# Patient Record
Sex: Female | Born: 1975 | Race: Black or African American | Hispanic: No | Marital: Married | Smoking: Never smoker
Health system: Southern US, Community
[De-identification: ages and names within clinical notes are randomized; demographics above are authoritative.]

## PROBLEM LIST (undated history)

## (undated) DIAGNOSIS — Z789 Other specified health status: Secondary | ICD-10-CM

## (undated) DIAGNOSIS — R87629 Unspecified abnormal cytological findings in specimens from vagina: Secondary | ICD-10-CM

## (undated) HISTORY — DX: Unspecified abnormal cytological findings in specimens from vagina: R87.629

---

## 2014-06-11 DIAGNOSIS — Z3009 Encounter for other general counseling and advice on contraception: Secondary | ICD-10-CM | POA: Insufficient documentation

## 2014-09-14 ENCOUNTER — Emergency Department (HOSPITAL_COMMUNITY)
Admission: EM | Admit: 2014-09-14 | Discharge: 2014-09-14 | Disposition: A | Discharge status: Home / Self Care | Payer: Medicaid Other | Attending: Emergency Medicine | Admitting: Emergency Medicine

## 2014-09-14 ENCOUNTER — Encounter (HOSPITAL_COMMUNITY): Payer: Self-pay | Admitting: Emergency Medicine

## 2014-09-14 DIAGNOSIS — M545 Low back pain, unspecified: Secondary | ICD-10-CM

## 2014-09-14 MED ORDER — CYCLOBENZAPRINE HCL 10 MG PO TABS: 10.0000 mg | ORAL_TABLET | Freq: Two times a day (BID) | ORAL | Status: DC | PRN

## 2014-09-14 MED ORDER — IBUPROFEN 400 MG PO TABS: 400.0000 mg | ORAL_TABLET | Freq: Four times a day (QID) | ORAL | Status: DC | PRN

## 2014-09-14 MED ORDER — HYDROCODONE-ACETAMINOPHEN 5-325 MG PO TABS
1.0000 | ORAL_TABLET | Freq: Once | ORAL | Status: AC
  Administered 2014-09-14: 1 via ORAL
  Filled 2014-09-14: qty 1

## 2014-09-14 NOTE — ED Provider Notes (Signed)
CSN: [id number]     Arrival date & time 09/14/14  1016 History  This chart was scribed for non-physician practitioner, Oswaldo ConroyVictoria Yurika Pereda, PA-C, working with Elwin MochaBlair Walden, MD by Charline BillsEssence Howell, ED Scribe. This patient was seen in room TR10C/TR10C and the patient's care was started at 11:21 AM.  Chief Complaint  Patient presents with  . Back Pain   The history is provided by the patient. No language interpreter was used.   HPI Comments: Shelley Peterson is a 39 y.o. female who presents to the Emergency Department complaining of sudden onset of lower back pain since this morning. Pt bent down to pick up laundry when she noticed pain this morning. Pt reports h/o intermittent back pain. She denies numbness/tinlging in groin, anal or lower extremities, weakness, urinary or bowel incontinence, abdominal pain, night sweats, h/o IV drug use or CA. Pt has been treating with ibuprofen without relief.   History reviewed. No pertinent past medical history. History reviewed. No pertinent past surgical history. No family history on file. History  Substance Use Topics  . Smoking status: Never Smoker   . Smokeless tobacco: Not on file  . Alcohol Use: No   OB History    No data available     Review of Systems  Constitutional: Negative for diaphoresis.  Gastrointestinal: Negative for abdominal pain.  Musculoskeletal: Positive for back pain.  Neurological: Negative for weakness and numbness.   Allergies  Review of patient's allergies indicates no known allergies.  Home Medications   Prior to Admission medications   Medication Sig Start Date End Date Taking? Authorizing Provider  cyclobenzaprine (FLEXERIL) 10 MG tablet Take 1 tablet (10 mg total) by mouth 2 (two) times daily as needed for muscle spasms. 09/14/14   Oswaldo ConroyVictoria Tupac Jeffus, PA-C  ibuprofen (ADVIL,MOTRIN) 400 MG tablet Take 1 tablet (400 mg total) by mouth every 6 (six) hours as needed. 09/14/14   Oswaldo ConroyVictoria Rome Schlauch, PA-C   BP 126/73 mmHg   Pulse 69  Temp(Src) 98.5 F (36.9 C) (Oral)  Resp 18  SpO2 100%  LMP 09/08/2014 Physical Exam  Constitutional: She appears well-developed and well-nourished. No distress.  HENT:  Head: Normocephalic and atraumatic.  Eyes: Conjunctivae are normal. Right eye exhibits no discharge. Left eye exhibits no discharge.  Cardiovascular: Normal rate, regular rhythm and normal heart sounds.   Pulmonary/Chest: Effort normal and breath sounds normal. No respiratory distress. She has no wheezes.  Abdominal: Soft. Bowel sounds are normal. She exhibits no distension. There is no tenderness.  Musculoskeletal:  No midline back tenderness, step off or crepitus. Right and left sided lower back tenderness. No CVA tenderness.   Neurological: She is alert. Coordination normal.  Equal muscle tone. 5/5 strength in lower extremities. DTR equal and intact. Negative straight leg test. Normal gait.  Skin: Skin is warm and dry. She is not diaphoretic.  Nursing note and vitals reviewed.  ED Course  Procedures (including critical care time) DIAGNOSTIC STUDIES: Oxygen Saturation is 100% on RA, normal by my interpretation.    COORDINATION OF CARE: 11:26 AM-Discussed treatment plan which includes ibuprofen and Vicodin with pt at bedside and pt agreed to plan.   Labs Review Labs Reviewed - No data to display  Imaging Review No results found.   EKG Interpretation None      MDM   Final diagnoses:  Bilateral low back pain without sciatica   Patient with back pain. No loss of bowel or bladder control. No saddle anesthesia. No fever, night sweats, weight loss, h/o cancer,  IVDU. VSS. No neurological deficits and normal neuro exam. Patient ambulatory. No concern for cauda equina.  RICE protocol and pain medicine indicated and discussed with patient. Driving and sedation precautions provided. Patient to follow-up with the wellness center to establish care and for persistent back symptoms. Patient is afebrile,  nontoxic, and in no acute distress. Patient is appropriate for outpatient management and is stable for discharge.  Discussed return precautions with patient. Discussed all results and patient verbalizes understanding and agrees with plan.  I personally performed the services described in this documentation, which was scribed in my presence. The recorded information has been reviewed and is accurate.   Oswaldo Conroy, PA-C 09/14/14 1133  Elwin Mocha, MD 09/14/14 [phone]

## 2014-09-14 NOTE — Discharge Instructions (Signed)
Return to the emergency room with worsening of symptoms, new symptoms or with symptoms that are concerning , especially fevers, loss of control of bladder or bowels, numbness or tingling around genital region or anus, weakness. RICE: Rest, Ice (three cycles of 20 mins on, 23mns off at least twice a day), compression/brace, elevation. Heating pad works well for back pain. Ibuprofen 40100m(2 tablets 20024mevery 5-6 hours for 3-5 days. Flexeril for severe pain. Do not operate machinery, drive or drink alcohol while taking narcotics or muscle relaxers. Follow up with the wellness center to establish care and follow up for persistent symtpoms. Read below information and follow recommendations.  Back Injury Prevention Back injuries can be extremely painful and difficult to heal. After having one back injury, you are much more likely to experience another later on. It is important to learn how to avoid injuring or re-injuring your back. The following tips can help you to prevent a back injury. PHYSICAL FITNESS  Exercise regularly and try to develop good tone in your abdominal muscles. Your abdominal muscles provide a lot of the support needed by your back.  Do aerobic exercises (walking, jogging, biking, swimming) regularly.  Do exercises that increase balance and strength (tai chi, yoga) regularly. This can decrease your risk of falling and injuring your back.  Stretch before and after exercising.  Maintain a healthy weight. The more you weigh, the more stress is placed on your back. For every pound of weight, 10 times that amount of pressure is placed on the back. DIET  Talk to your caregiver about how much calcium and vitamin D you need per day. These nutrients help to prevent weakening of the bones (osteoporosis). Osteoporosis can cause broken (fractured) bones that lead to back pain.  Include good sources of calcium in your diet, such as dairy products, green, leafy vegetables, and products  with calcium added (fortified).  Include good sources of vitamin D in your diet, such as milk and foods that are fortified with vitamin D.  Consider taking a nutritional supplement or a multivitamin if needed.  Stop smoking if you smoke. POSTURE  Sit and stand up straight. Avoid leaning forward when you sit or hunching over when you stand.  Choose chairs with good low back (lumbar) support.  If you work at a desk, sit close to your work so you do not need to lean over. Keep your chin tucked in. Keep your neck drawn back and elbows bent at a right angle. Your arms should look like the letter "L."  Sit high and close to the steering wheel when you drive. Add a lumbar support to your car seat if needed.  Avoid sitting or standing in one position for too long. Take breaks to get up, stretch, and walk around at least once every hour. Take breaks if you are driving for long periods of time.  Sleep on your side with your knees slightly bent, or sleep on your back with a pillow under your knees. Do not sleep on your stomach. LIFTING, TWISTING, AND REACHING  Avoid heavy lifting, especially repetitive lifting. If you must do heavy lifting:  Stretch before lifting.  Work slowly.  Rest between lifts.  Use carts and dollies to move objects when possible.  Make several small trips instead of carrying 1 heavy load.  Ask for help when you need it.  Ask for help when moving big, awkward objects.  Follow these steps when lifting:  Stand with your feet shoulder-width apart.  Get as close to the object as you can. Do not try to pick up heavy objects that are far from your body.  Use handles or lifting straps if they are available.  Bend at your knees. Squat down, but keep your heels off the floor.  Keep your shoulders pulled back, your chin tucked in, and your back straight.  Lift the object slowly, tightening the muscles in your legs, abdomen, and buttocks. Keep the object as close to  the center of your body as possible.  When you put a load down, use these same guidelines in reverse.  Do not:  Lift the object above your waist.  Twist at the waist while lifting or carrying a load. Move your feet if you need to turn, not your waist.  Bend over without bending at your knees.  Avoid reaching over your head, across a table, or for an object on a high surface. OTHER TIPS  Avoid wet floors and keep sidewalks clear of ice to prevent falls.  Do not sleep on a mattress that is too soft or too hard.  Keep items that are used frequently within easy reach.  Put heavier objects on shelves at waist level and lighter objects on lower or higher shelves.  Find ways to decrease your stress, such as exercise, massage, or relaxation techniques. Stress can build up in your muscles. Tense muscles are more vulnerable to injury.  Seek treatment for depression or anxiety if needed. These conditions can increase your risk of developing back pain. SEEK MEDICAL CARE IF:  You injure your back.  You have questions about diet, exercise, or other ways to prevent back injuries. MAKE SURE YOU:  Understand these instructions.  Will watch your condition.  Will get help right away if you are not doing well or get worse. Document Released: 07/20/2004 Document Revised: 09/04/2011 Document Reviewed: 07/24/2011 [facility] Patient Information 2015 [place], [place]. This information is not intended to replace advice given to you by your health care provider. Make sure you discuss any questions you have with your health care provider.

## 2014-09-14 NOTE — ED Notes (Signed)
Patient states she bent down to pick up laundry this morning and pulled her back.  Patient complains of lower back pain.   Denies other symptoms.

## 2014-11-27 LAB — OB RESULTS CONSOLE HEPATITIS B SURFACE ANTIGEN: Hepatitis B Surface Ag: NEGATIVE

## 2014-11-27 LAB — OB RESULTS CONSOLE ABO/RH: RH TYPE: POSITIVE

## 2014-11-27 LAB — OB RESULTS CONSOLE GC/CHLAMYDIA
Chlamydia: NEGATIVE
Gonorrhea: NEGATIVE

## 2014-11-27 LAB — OB RESULTS CONSOLE RUBELLA ANTIBODY, IGM: Rubella: IMMUNE

## 2014-11-27 LAB — OB RESULTS CONSOLE HIV ANTIBODY (ROUTINE TESTING): HIV: NONREACTIVE

## 2014-11-27 LAB — OB RESULTS CONSOLE ANTIBODY SCREEN: ANTIBODY SCREEN: NEGATIVE

## 2015-02-03 ENCOUNTER — Other Ambulatory Visit (HOSPITAL_COMMUNITY): Payer: Self-pay | Admitting: Nurse Practitioner

## 2015-02-03 DIAGNOSIS — O09522 Supervision of elderly multigravida, second trimester: Secondary | ICD-10-CM

## 2015-02-03 DIAGNOSIS — Z3A19 19 weeks gestation of pregnancy: Secondary | ICD-10-CM

## 2015-02-08 ENCOUNTER — Ambulatory Visit (HOSPITAL_COMMUNITY): Payer: Medicaid Other

## 2015-02-23 ENCOUNTER — Other Ambulatory Visit (HOSPITAL_COMMUNITY): Payer: Self-pay | Admitting: Nurse Practitioner

## 2015-02-23 ENCOUNTER — Encounter (HOSPITAL_COMMUNITY): Payer: Self-pay

## 2015-02-23 ENCOUNTER — Ambulatory Visit (HOSPITAL_COMMUNITY)
Admission: RE | Admit: 2015-02-23 | Discharge: 2015-02-23 | Disposition: A | Discharge status: Home / Self Care | Payer: Medicaid Other | Source: Ambulatory Visit | Attending: Nurse Practitioner | Admitting: Nurse Practitioner

## 2015-02-23 DIAGNOSIS — O09522 Supervision of elderly multigravida, second trimester: Secondary | ICD-10-CM

## 2015-02-23 DIAGNOSIS — O3421 Maternal care for scar from previous cesarean delivery: Secondary | ICD-10-CM | POA: Insufficient documentation

## 2015-02-23 DIAGNOSIS — O09512 Supervision of elderly primigravida, second trimester: Secondary | ICD-10-CM | POA: Diagnosis present

## 2015-02-23 DIAGNOSIS — Z3A21 21 weeks gestation of pregnancy: Secondary | ICD-10-CM | POA: Insufficient documentation

## 2015-02-23 DIAGNOSIS — Z3A19 19 weeks gestation of pregnancy: Secondary | ICD-10-CM

## 2015-02-23 DIAGNOSIS — Z36 Encounter for antenatal screening of mother: Secondary | ICD-10-CM | POA: Diagnosis not present

## 2015-02-23 HISTORY — DX: Other specified health status: Z78.9

## 2015-02-24 ENCOUNTER — Other Ambulatory Visit (HOSPITAL_COMMUNITY): Payer: Self-pay | Admitting: Nurse Practitioner

## 2015-04-12 LAB — OB RESULTS CONSOLE RPR: RPR: NONREACTIVE

## 2015-04-28 ENCOUNTER — Encounter (HOSPITAL_COMMUNITY): Payer: Self-pay | Admitting: *Deleted

## 2015-05-22 ENCOUNTER — Other Ambulatory Visit: Payer: Self-pay | Admitting: Family Medicine

## 2015-06-24 ENCOUNTER — Encounter (HOSPITAL_COMMUNITY): Payer: Self-pay

## 2015-06-24 NOTE — Patient Instructions (Signed)
Your procedure is scheduled on:  Monday, Jan. 2, 2017  [facility] at: 6:00 A.M.  Sign your name on the clipboard at the desk and inform the staff you are a Pre-Registered Cesarean Section  Remember: Do NOT eat food or drink after:  Midnight Sunday Take these medicines the morning of surgery with a SIP OF WATER:  None  Do NOT wear jewelry (body piercing), metal hair clips/bobby pins, or nail polish. Do NOT wear lotions, powders, or perfumes.  You may wear deoderant. Do NOT shave for 48 hours prior to surgery. Do NOT bring valuables to the hospital.  Leave suitcase in car.  After surgery it may be brought to your room.  For patients admitted to the hospital, checkout time is 11:00 AM the day of discharge.

## 2015-06-25 ENCOUNTER — Encounter (HOSPITAL_COMMUNITY): Payer: Self-pay

## 2015-06-25 ENCOUNTER — Encounter (HOSPITAL_COMMUNITY)
Admission: RE | Admit: 2015-06-25 | Discharge: 2015-06-25 | Disposition: A | Discharge status: Home / Self Care | Payer: Medicaid Other | Source: Ambulatory Visit | Attending: Family Medicine | Admitting: Family Medicine

## 2015-06-25 ENCOUNTER — Inpatient Hospital Stay (HOSPITAL_COMMUNITY)
Admission: AD | Admit: 2015-06-25 | Discharge: 2015-06-28 | DRG: 766 | Disposition: A | Discharge status: Home / Self Care | Payer: Medicaid Other | Source: Ambulatory Visit | Attending: Obstetrics and Gynecology | Admitting: Obstetrics and Gynecology

## 2015-06-25 ENCOUNTER — Encounter (HOSPITAL_COMMUNITY)
Admission: AD | Disposition: A | Discharge status: Home / Self Care | Payer: Self-pay | Source: Ambulatory Visit | Attending: Obstetrics and Gynecology

## 2015-06-25 ENCOUNTER — Inpatient Hospital Stay (HOSPITAL_COMMUNITY): Payer: Medicaid Other | Admitting: Anesthesiology

## 2015-06-25 DIAGNOSIS — Z3A38 38 weeks gestation of pregnancy: Secondary | ICD-10-CM | POA: Diagnosis not present

## 2015-06-25 DIAGNOSIS — O34211 Maternal care for low transverse scar from previous cesarean delivery: Secondary | ICD-10-CM | POA: Diagnosis present

## 2015-06-25 DIAGNOSIS — Z98891 History of uterine scar from previous surgery: Secondary | ICD-10-CM

## 2015-06-25 LAB — URINALYSIS, ROUTINE W REFLEX MICROSCOPIC
Bilirubin Urine: NEGATIVE
GLUCOSE, UA: NEGATIVE mg/dL
Hgb urine dipstick: NEGATIVE
KETONES UR: NEGATIVE mg/dL
LEUKOCYTES UA: NEGATIVE
NITRITE: NEGATIVE
Protein, ur: NEGATIVE mg/dL
Specific Gravity, Urine: 1.01 (ref 1.005–1.030)
pH: 6 (ref 5.0–8.0)

## 2015-06-25 LAB — CBC
HCT: 37.4 % (ref 36.0–46.0)
Hemoglobin: 12.4 g/dL (ref 12.0–15.0)
MCH: 28.8 pg (ref 26.0–34.0)
MCHC: 33.2 g/dL (ref 30.0–36.0)
MCV: 87 fL (ref 78.0–100.0)
Platelets: 245 10*3/uL (ref 150–400)
RBC: 4.3 MIL/uL (ref 3.87–5.11)
RDW: 16.4 % — AB (ref 11.5–15.5)
WBC: 7.9 10*3/uL (ref 4.0–10.5)

## 2015-06-25 LAB — RAPID HIV SCREEN (HIV 1/2 AB+AG)
HIV 1/2 ANTIBODIES: NONREACTIVE
HIV-1 P24 ANTIGEN - HIV24: NONREACTIVE

## 2015-06-25 LAB — TYPE AND SCREEN
ABO/RH(D): O POS
ANTIBODY SCREEN: NEGATIVE

## 2015-06-25 LAB — ABO/RH: ABO/RH(D): O POS

## 2015-06-25 SURGERY — Surgical Case
Anesthesia: Spinal

## 2015-06-25 MED ORDER — ACETAMINOPHEN 500 MG PO TABS
1000.0000 mg | ORAL_TABLET | Freq: Four times a day (QID) | ORAL | Status: AC
  Administered 2015-06-26: 1000 mg via ORAL
  Filled 2015-06-25: qty 2

## 2015-06-25 MED ORDER — FENTANYL CITRATE (PF) 100 MCG/2ML IJ SOLN
INTRAMUSCULAR | Status: AC
  Filled 2015-06-25: qty 2

## 2015-06-25 MED ORDER — LANOLIN HYDROUS EX OINT: 1.0000 "application " | TOPICAL_OINTMENT | CUTANEOUS | Status: DC | PRN

## 2015-06-25 MED ORDER — TETANUS-DIPHTH-ACELL PERTUSSIS 5-2.5-18.5 LF-MCG/0.5 IM SUSP
0.5000 mL | Freq: Once | INTRAMUSCULAR | Status: DC
Start: 2015-06-26 — End: 2015-06-26

## 2015-06-25 MED ORDER — CITRIC ACID-SODIUM CITRATE 334-500 MG/5ML PO SOLN
30.0000 mL | Freq: Once | ORAL | Status: DC
  Filled 2015-06-25: qty 15

## 2015-06-25 MED ORDER — MORPHINE SULFATE (PF) 0.5 MG/ML IJ SOLN
INTRAMUSCULAR | Status: AC
  Filled 2015-06-25: qty 10

## 2015-06-25 MED ORDER — MENTHOL 3 MG MT LOZG: 1.0000 | LOZENGE | OROMUCOSAL | Status: DC | PRN

## 2015-06-25 MED ORDER — ACETAMINOPHEN 325 MG PO TABS
650.0000 mg | ORAL_TABLET | ORAL | Status: DC | PRN
  Administered 2015-06-27: 650 mg via ORAL
  Filled 2015-06-25: qty 2

## 2015-06-25 MED ORDER — MEPERIDINE HCL 25 MG/ML IJ SOLN: 6.2500 mg | INTRAMUSCULAR | Status: DC | PRN

## 2015-06-25 MED ORDER — CEFAZOLIN SODIUM-DEXTROSE 2-3 GM-% IV SOLR
2.0000 g | INTRAVENOUS | Status: DC
  Filled 2015-06-25: qty 50

## 2015-06-25 MED ORDER — OXYTOCIN 40 UNITS IN LACTATED RINGERS INFUSION - SIMPLE MED: 62.5000 mL/h | INTRAVENOUS | Status: AC

## 2015-06-25 MED ORDER — SIMETHICONE 80 MG PO CHEW: 80.0000 mg | CHEWABLE_TABLET | ORAL | Status: DC | PRN

## 2015-06-25 MED ORDER — SCOPOLAMINE 1 MG/3DAYS TD PT72
MEDICATED_PATCH | TRANSDERMAL | Status: DC | PRN
  Administered 2015-06-25: 1 via TRANSDERMAL

## 2015-06-25 MED ORDER — DIPHENHYDRAMINE HCL 25 MG PO CAPS: 25.0000 mg | ORAL_CAPSULE | Freq: Four times a day (QID) | ORAL | Status: DC | PRN

## 2015-06-25 MED ORDER — ONDANSETRON HCL 4 MG/2ML IJ SOLN
INTRAMUSCULAR | Status: DC | PRN
  Administered 2015-06-25: 4 mg via INTRAVENOUS

## 2015-06-25 MED ORDER — FAMOTIDINE IN NACL 20-0.9 MG/50ML-% IV SOLN
20.0000 mg | Freq: Once | INTRAVENOUS | Status: AC
  Administered 2015-06-25: 20 mg via INTRAVENOUS
  Filled 2015-06-25: qty 50

## 2015-06-25 MED ORDER — DIPHENHYDRAMINE HCL 50 MG/ML IJ SOLN: 12.5000 mg | INTRAMUSCULAR | Status: DC | PRN

## 2015-06-25 MED ORDER — HYDROMORPHONE HCL 1 MG/ML IJ SOLN: 0.2500 mg | INTRAMUSCULAR | Status: DC | PRN

## 2015-06-25 MED ORDER — KETOROLAC TROMETHAMINE 30 MG/ML IJ SOLN
INTRAMUSCULAR | Status: AC
  Administered 2015-06-25: 30 mg via INTRAMUSCULAR
  Filled 2015-06-25: qty 1

## 2015-06-25 MED ORDER — NALBUPHINE HCL 10 MG/ML IJ SOLN
5.0000 mg | INTRAMUSCULAR | Status: DC | PRN
Start: 2015-06-25 — End: 2015-06-28

## 2015-06-25 MED ORDER — KETOROLAC TROMETHAMINE 30 MG/ML IJ SOLN: 30.0000 mg | Freq: Four times a day (QID) | INTRAMUSCULAR | Status: DC | PRN

## 2015-06-25 MED ORDER — NALOXONE HCL 0.4 MG/ML IJ SOLN: 0.4000 mg | INTRAMUSCULAR | Status: DC | PRN

## 2015-06-25 MED ORDER — SENNOSIDES-DOCUSATE SODIUM 8.6-50 MG PO TABS
2.0000 | ORAL_TABLET | ORAL | Status: DC
  Administered 2015-06-27 (×2): 2 via ORAL
  Filled 2015-06-25 (×2): qty 2

## 2015-06-25 MED ORDER — NALBUPHINE HCL 10 MG/ML IJ SOLN: 5.0000 mg | INTRAMUSCULAR | Status: DC | PRN

## 2015-06-25 MED ORDER — ONDANSETRON HCL 4 MG/2ML IJ SOLN
4.0000 mg | Freq: Three times a day (TID) | INTRAMUSCULAR | Status: DC | PRN
  Administered 2015-06-25: 4 mg via INTRAVENOUS
  Filled 2015-06-25: qty 2

## 2015-06-25 MED ORDER — LACTATED RINGERS IV SOLN
INTRAVENOUS | Status: DC
Start: 2015-06-25 — End: 2015-06-28

## 2015-06-25 MED ORDER — CEFAZOLIN SODIUM-DEXTROSE 2-3 GM-% IV SOLR
INTRAVENOUS | Status: AC
  Filled 2015-06-25: qty 50

## 2015-06-25 MED ORDER — SODIUM CHLORIDE 0.9 % IJ SOLN
3.0000 mL | INTRAMUSCULAR | Status: DC | PRN
  Administered 2015-06-26: 3 mL via INTRAVENOUS
  Filled 2015-06-25: qty 3

## 2015-06-25 MED ORDER — FENTANYL CITRATE (PF) 100 MCG/2ML IJ SOLN
INTRAMUSCULAR | Status: DC | PRN
  Administered 2015-06-25: 12.5 ug via INTRATHECAL

## 2015-06-25 MED ORDER — LACTATED RINGERS IV SOLN
INTRAVENOUS | Status: DC
  Administered 2015-06-25 (×2): via INTRAVENOUS

## 2015-06-25 MED ORDER — PRENATAL MULTIVITAMIN CH
1.0000 | ORAL_TABLET | Freq: Every day | ORAL | Status: DC
  Administered 2015-06-26 – 2015-06-28 (×3): 1 via ORAL
  Filled 2015-06-25 (×3): qty 1

## 2015-06-25 MED ORDER — WITCH HAZEL-GLYCERIN EX PADS: 1.0000 "application " | MEDICATED_PAD | CUTANEOUS | Status: DC | PRN

## 2015-06-25 MED ORDER — MORPHINE SULFATE (PF) 0.5 MG/ML IJ SOLN
INTRAMUSCULAR | Status: DC | PRN
  Administered 2015-06-25: .2 ug via INTRATHECAL

## 2015-06-25 MED ORDER — PROMETHAZINE HCL 25 MG/ML IJ SOLN: 6.2500 mg | INTRAMUSCULAR | Status: DC | PRN

## 2015-06-25 MED ORDER — ZOLPIDEM TARTRATE 5 MG PO TABS: 5.0000 mg | ORAL_TABLET | Freq: Every evening | ORAL | Status: DC | PRN

## 2015-06-25 MED ORDER — PHENYLEPHRINE 8 MG IN D5W 100 ML (0.08MG/ML) PREMIX OPTIME
INJECTION | INTRAVENOUS | Status: AC
  Filled 2015-06-25: qty 100

## 2015-06-25 MED ORDER — KETOROLAC TROMETHAMINE 30 MG/ML IJ SOLN
30.0000 mg | Freq: Four times a day (QID) | INTRAMUSCULAR | Status: DC | PRN
  Administered 2015-06-25: 30 mg via INTRAMUSCULAR

## 2015-06-25 MED ORDER — SIMETHICONE 80 MG PO CHEW
80.0000 mg | CHEWABLE_TABLET | Freq: Three times a day (TID) | ORAL | Status: DC
  Administered 2015-06-26 – 2015-06-28 (×8): 80 mg via ORAL
  Filled 2015-06-25 (×8): qty 1

## 2015-06-25 MED ORDER — BUPIVACAINE IN DEXTROSE 0.75-8.25 % IT SOLN
INTRATHECAL | Status: DC | PRN
  Administered 2015-06-25: 10.5 mg via INTRATHECAL

## 2015-06-25 MED ORDER — IBUPROFEN 600 MG PO TABS
600.0000 mg | ORAL_TABLET | Freq: Four times a day (QID) | ORAL | Status: DC | PRN
Start: 2015-06-25 — End: 2015-06-25

## 2015-06-25 MED ORDER — OXYTOCIN 10 UNIT/ML IJ SOLN
INTRAMUSCULAR | Status: AC
  Filled 2015-06-25: qty 4

## 2015-06-25 MED ORDER — PHENYLEPHRINE 40 MCG/ML (10ML) SYRINGE FOR IV PUSH (FOR BLOOD PRESSURE SUPPORT)
PREFILLED_SYRINGE | INTRAVENOUS | Status: AC
  Filled 2015-06-25: qty 10

## 2015-06-25 MED ORDER — CITRIC ACID-SODIUM CITRATE 334-500 MG/5ML PO SOLN
30.0000 mL | ORAL | Status: AC
Start: 2015-06-25 — End: 2015-06-25
  Administered 2015-06-25: 30 mL via ORAL

## 2015-06-25 MED ORDER — DIPHENHYDRAMINE HCL 25 MG PO CAPS: 25.0000 mg | ORAL_CAPSULE | ORAL | Status: DC | PRN

## 2015-06-25 MED ORDER — SIMETHICONE 80 MG PO CHEW
80.0000 mg | CHEWABLE_TABLET | ORAL | Status: DC
  Administered 2015-06-27: 80 mg via ORAL
  Filled 2015-06-25 (×2): qty 1

## 2015-06-25 MED ORDER — NALBUPHINE HCL 10 MG/ML IJ SOLN: 5.0000 mg | Freq: Once | INTRAMUSCULAR | Status: DC | PRN

## 2015-06-25 MED ORDER — ONDANSETRON HCL 4 MG/2ML IJ SOLN
INTRAMUSCULAR | Status: AC
  Filled 2015-06-25: qty 2

## 2015-06-25 MED ORDER — DIBUCAINE 1 % RE OINT: 1.0000 "application " | TOPICAL_OINTMENT | RECTAL | Status: DC | PRN

## 2015-06-25 MED ORDER — OXYCODONE-ACETAMINOPHEN 5-325 MG PO TABS: 2.0000 | ORAL_TABLET | ORAL | Status: DC | PRN

## 2015-06-25 MED ORDER — LACTATED RINGERS IV BOLUS (SEPSIS)
1000.0000 mL | Freq: Once | INTRAVENOUS | Status: AC
  Administered 2015-06-25: 1000 mL via INTRAVENOUS

## 2015-06-25 MED ORDER — NALOXONE HCL 2 MG/2ML IJ SOSY: 1.0000 ug/kg/h | PREFILLED_SYRINGE | INTRAVENOUS | Status: DC | PRN

## 2015-06-25 MED ORDER — PHENYLEPHRINE HCL 10 MG/ML IJ SOLN
INTRAMUSCULAR | Status: DC | PRN
  Administered 2015-06-25 (×2): 80 ug via INTRAVENOUS

## 2015-06-25 MED ORDER — OXYCODONE-ACETAMINOPHEN 5-325 MG PO TABS
1.0000 | ORAL_TABLET | ORAL | Status: DC | PRN
  Administered 2015-06-26: 1 via ORAL
  Filled 2015-06-25: qty 1

## 2015-06-25 MED ORDER — OXYTOCIN 10 UNIT/ML IJ SOLN
40.0000 [IU] | INTRAVENOUS | Status: DC | PRN
  Administered 2015-06-25: 40 [IU] via INTRAVENOUS

## 2015-06-25 MED ORDER — SODIUM CHLORIDE 0.9 % IR SOLN
Status: DC | PRN
  Administered 2015-06-25: 1000 mL

## 2015-06-25 MED ORDER — SCOPOLAMINE 1 MG/3DAYS TD PT72: 1.0000 | MEDICATED_PATCH | Freq: Once | TRANSDERMAL | Status: DC

## 2015-06-25 MED ORDER — CEFAZOLIN SODIUM-DEXTROSE 2-3 GM-% IV SOLR
2.0000 g | INTRAVENOUS | Status: AC
  Administered 2015-06-25: 2 g via INTRAVENOUS

## 2015-06-25 MED ORDER — IBUPROFEN 600 MG PO TABS
600.0000 mg | ORAL_TABLET | Freq: Four times a day (QID) | ORAL | Status: DC
  Administered 2015-06-26 – 2015-06-28 (×10): 600 mg via ORAL
  Filled 2015-06-25 (×9): qty 1

## 2015-06-25 MED ORDER — PHENYLEPHRINE 8 MG IN D5W 100 ML (0.08MG/ML) PREMIX OPTIME
INJECTION | INTRAVENOUS | Status: DC | PRN
  Administered 2015-06-25: 60 ug/min via INTRAVENOUS

## 2015-06-25 SURGICAL SUPPLY — 38 items
BENZOIN TINCTURE PRP APPL 2/3 (GAUZE/BANDAGES/DRESSINGS) ×2 IMPLANT
CLAMP CORD UMBIL (MISCELLANEOUS) IMPLANT
CLOTH BEACON ORANGE TIMEOUT ST (SAFETY) ×2 IMPLANT
CLSR STERI-STRIP ANTIMIC 1/2X4 (GAUZE/BANDAGES/DRESSINGS) ×2 IMPLANT
DRAPE SHEET LG 3/4 BI-LAMINATE (DRAPES) IMPLANT
DRSG OPSITE POSTOP 4X10 (GAUZE/BANDAGES/DRESSINGS) ×2 IMPLANT
DURAPREP 26ML APPLICATOR (WOUND CARE) ×2 IMPLANT
ELECT REM PT RETURN 9FT ADLT (ELECTROSURGICAL) ×2
ELECTRODE REM PT RTRN 9FT ADLT (ELECTROSURGICAL) ×1 IMPLANT
EXTRACTOR VACUUM KIWI (MISCELLANEOUS) ×2 IMPLANT
GLOVE BIO SURGEON ST LM GN SZ9 (GLOVE) ×2 IMPLANT
GLOVE BIOGEL PI IND STRL 7.0 (GLOVE) ×1 IMPLANT
GLOVE BIOGEL PI IND STRL 9 (GLOVE) ×1 IMPLANT
GLOVE BIOGEL PI INDICATOR 7.0 (GLOVE) ×1
GLOVE BIOGEL PI INDICATOR 9 (GLOVE) ×1
GOWN STRL REUS W/TWL 2XL LVL3 (GOWN DISPOSABLE) ×2 IMPLANT
GOWN STRL REUS W/TWL LRG LVL3 (GOWN DISPOSABLE) ×2 IMPLANT
NEEDLE HYPO 25X5/8 SAFETYGLIDE (NEEDLE) IMPLANT
NS IRRIG 1000ML POUR BTL (IV SOLUTION) ×2 IMPLANT
PACK C SECTION WH (CUSTOM PROCEDURE TRAY) ×2 IMPLANT
PAD ABD DERMACEA PRESS 5X9 (GAUZE/BANDAGES/DRESSINGS) ×2 IMPLANT
PAD OB MATERNITY 4.3X12.25 (PERSONAL CARE ITEMS) ×2 IMPLANT
PENCIL SMOKE EVAC W/HOLSTER (ELECTROSURGICAL) ×2 IMPLANT
RTRCTR C-SECT PINK 25CM LRG (MISCELLANEOUS) IMPLANT
RTRCTR C-SECT PINK 34CM XLRG (MISCELLANEOUS) IMPLANT
STRIP CLOSURE SKIN 1/2X4 (GAUZE/BANDAGES/DRESSINGS) IMPLANT
SUT MNCRL 0 VIOLET CTX 36 (SUTURE) ×2 IMPLANT
SUT MONOCRYL 0 CTX 36 (SUTURE) ×2
SUT PLAIN 2 0 (SUTURE) ×1
SUT PLAIN ABS 2-0 CT1 27XMFL (SUTURE) ×1 IMPLANT
SUT VIC AB 0 CT1 27 (SUTURE) ×2
SUT VIC AB 0 CT1 27XBRD ANBCTR (SUTURE) ×2 IMPLANT
SUT VIC AB 2-0 CT1 27 (SUTURE) ×1
SUT VIC AB 2-0 CT1 TAPERPNT 27 (SUTURE) ×1 IMPLANT
SUT VIC AB 4-0 KS 27 (SUTURE) ×4 IMPLANT
SYR BULB IRRIGATION 50ML (SYRINGE) IMPLANT
TOWEL OR 17X24 6PK STRL BLUE (TOWEL DISPOSABLE) ×2 IMPLANT
TRAY FOLEY CATH SILVER 14FR (SET/KITS/TRAYS/PACK) ×2 IMPLANT

## 2015-06-25 NOTE — Op Note (Signed)
06/25/2015    7:35 PM  PATIENT:  Shelley Peterson  39 y.o. female  PRE-OPERATIVE DIAGNOSIS:  Early Labor, Repeat Cesarean Section, Declining Trial Of Labor  POST-OPERATIVE DIAGNOSIS:  Early Labor, Repeat Cesarean Section, Declining Trial Of Labor  PROCEDURE:  Procedure(s): CESAREAN SECTION (N/A)  SURGEON:  Surgeon(s) and Role:    * Tilda Burrow, MD - Primary    * Lyndel Safe, MD- Fellow  ANESTHESIA:   spinal  EBL:  Total I/O In: 500 [I.V.:500] Out: 650 [Urine:150; Blood:500]  BLOOD ADMINISTERED:none  DRAINS: none   INDICATIONS: Shelley Peterson is a 38 y.o. G3P2011 at [redacted]w[redacted]d here for cesarean section secondary to the indications listed under preoperative diagnoses; please see preoperative note for further details.  The risks of cesarean section were discussed with the patient including but were not limited to: bleeding which may require transfusion or reoperation; infection which may require antibiotics; injury to bowel, bladder, ureters or other surrounding organs; injury to the fetus; need for additional procedures including hysterectomy in the event of a life-threatening hemorrhage; placental abnormalities wth subsequent pregnancies, incisional problems, thromboembolic phenomenon and other postoperative/anesthesia complications.   The patient concurred with the proposed plan, giving informed written consent for the procedure.    FINDINGS:  Viable female infant in cephalic presentation.  Apgars 8 and 9.  Clear amniotic fluid.  Intact placenta, three vessel cord.  Normal uterus, fallopian tubes and ovaries bilaterally. Transected left rectus (repaired)  PROCEDURE IN DETAIL:  The patient preoperatively received intravenous antibiotics and had sequential compression devices applied to her lower extremities.  She was then taken to the operating room where spinal anesthesia was administered and was found to be adequate. She was then placed in a dorsal supine position with a  leftward tilt, and prepped and draped in a sterile manner.  A foley catheter was placed into her bladder and attached to constant gravity.  After an adequate timeout was performed, a Pfannenstiel skin incision was made with scalpel and carried through to the underlying layer of fascia. The fascia was incised in the midline, and this incision was extended bilaterally using the Mayo scissors.  Kocher clamps were applied to the superior aspect of the fascial incision and the underlying rectus muscles were dissected off bluntly. A similar process was carried out on the inferior aspect of the fascial incision. The rectus muscles were separated in the midline bluntly and the peritoneum was entered bluntly. Attention was turned to the lower uterine segment where a low transverse hysterotomy was made with a scalpel and extended bilaterally bluntly.  The infant was successfully delivered, the cord was clamped and cut and the infant was handed over to awaiting neonatology team. Uterine massage was then administered, and the placenta delivered intact with a three-vessel cord. The uterus was then cleared of clot and debris.  The hysterotomy was closed with 0 Monocryle in a running locked fashion, and an imbricating layer was also placed with 0 Monocryl. The pelvis was cleared of all clot and debris and irrigated. Hemostasis was confirmed on all surfaces.  Noted transected left rectus muscle that was reapproximated with a figure of eight suture. The peritoneum and the muscles were reapproximated using 0 Vicryl in a running fashion. The fascia was then closed using 0 Vicryl in a running fashion. 2-0 plain gut interrupted stitches.  The skin was closed with a 4-0 Vicryl subcuticular stitch. The patient tolerated the procedure well. Sponge, lap, instrument and needle counts were correct x 2.  She was  taken to the recovery room in stable condition.    Federico FlakeKimberly Niles Layah Skousen, MD Family Medicine, OB Fellow [facility]

## 2015-06-25 NOTE — Brief Op Note (Signed)
06/25/2015  7:35 PM  PATIENT:  Shelley Peterson  39 y.o. female  PRE-OPERATIVE DIAGNOSIS:  Early Labor, Repeat Cesarean Section, Declining Trial Of Labor  POST-OPERATIVE DIAGNOSIS:  Early Labor, Repeat Cesarean Section, Declining Trial Of Labor  PROCEDURE:  Procedure(s): CESAREAN SECTION (N/A)  SURGEON:  Surgeon(s) and Role:    * Tilda BurrowJohn Ferguson V, MD - Primary    * Lyndel SafeKimberly Makar Slatter, MD- Fellow  ANESTHESIA:   spinal  EBL:  Total I/O In: 500 [I.V.:500] Out: 650 [Urine:150; Blood:500]  BLOOD ADMINISTERED:none  DRAINS: none   LOCAL MEDICATIONS USED:  NONE  SPECIMEN:  Source of Specimen:  Placenta  DISPOSITION OF SPECIMEN:  Labor and Delivery  COUNTS:  YES  TOURNIQUET:  * No tourniquets in log *  DICTATION: .Note written in EPIC  PLAN OF CARE: Admit to inpatient   PATIENT DISPOSITION:  PACU - hemodynamically stable.   Delay start of Pharmacological VTE agent (>24hrs) due to surgical blood loss or risk of bleeding: yes

## 2015-06-25 NOTE — Anesthesia Postprocedure Evaluation (Signed)
Anesthesia Post Note  Patient: Shelley Peterson  Procedure(s) Performed: Procedure(s) (LRB): CESAREAN SECTION (N/A)  Patient location during evaluation: PACU Anesthesia Type: Spinal Level of consciousness: oriented and awake and alert Pain management: pain level controlled Vital Signs Assessment: post-procedure vital signs reviewed and stable Respiratory status: spontaneous breathing, respiratory function stable and patient connected to nasal cannula oxygen Cardiovascular status: blood pressure returned to baseline and stable Postop Assessment: no headache and no backache Anesthetic complications: no    Last Vitals:  Filed Vitals:   06/25/15 2045 06/25/15 2100  BP: 125/77 129/81  Pulse: 103 100  Temp: 36.5 C   Resp: 20 20    Last Pain:  Filed Vitals:   06/25/15 2104  PainSc: 0-No pain                 Shelton SilvasKevin D Faigy Stretch

## 2015-06-25 NOTE — Anesthesia Preprocedure Evaluation (Addendum)
Anesthesia Evaluation  Patient identified by MRN, date of birth, ID band Patient awake    Reviewed: Allergy & Precautions, H&P , NPO status , Patient's Chart, lab work & pertinent test results  Airway Mallampati: I  TM Distance: >3 FB Neck ROM: full    Dental no notable dental hx.    Pulmonary neg pulmonary ROS,    Pulmonary exam normal        Cardiovascular negative cardio ROS Normal cardiovascular exam     Neuro/Psych negative neurological ROS  negative psych ROS   GI/Hepatic negative GI ROS, Neg liver ROS,   Endo/Other  negative endocrine ROS  Renal/GU negative Renal ROS     Musculoskeletal   Abdominal (+) + obese,   Peds  Hematology negative hematology ROS (+)   Anesthesia Other Findings   Reproductive/Obstetrics (+) Pregnancy                             Anesthesia Physical Anesthesia Plan  ASA: II  Anesthesia Plan: Spinal   Post-op Pain Management:    Induction:   Airway Management Planned:   Additional Equipment:   Intra-op Plan:   Post-operative Plan:   Informed Consent: I have reviewed the patients History and Physical, chart, labs and discussed the procedure including the risks, benefits and alternatives for the proposed anesthesia with the patient or authorized representative who has indicated his/her understanding and acceptance.     Plan Discussed with: CRNA and Surgeon  Anesthesia Plan Comments:        Anesthesia Quick Evaluation

## 2015-06-25 NOTE — Anesthesia Procedure Notes (Addendum)
Spinal Patient location during procedure: OR Start time: 06/25/2015 6:22 PM End time: 06/25/2015 6:26 PM Staffing Anesthesiologist: Leilani AbleHATCHETT, Berlene Dixson Performed by: anesthesiologist  Preanesthetic Checklist Completed: patient identified, surgical consent, pre-op evaluation, timeout performed, IV checked, risks and benefits discussed and monitors and equipment checked Spinal Block Patient position: sitting Prep: site prepped and draped and DuraPrep Patient monitoring: heart rate, cardiac monitor, continuous pulse ox and blood pressure Approach: midline Location: L3-4 Injection technique: single-shot Needle Needle type: Pencan  Needle gauge: 24 G Needle length: 9 cm Needle insertion depth: 5 cm Assessment Sensory level: T4

## 2015-06-25 NOTE — MAU Note (Signed)
PATIENT PRESENTS WITH ABDOMINAL PAIN X 1 MONTH UNSURE IF CONTRACTIONS, GOES TO [facility]. DENIES BLEEDING NO SROM

## 2015-06-25 NOTE — H&P (Signed)
Obstetric Preoperative History and Physical  Shelley Peterson is a 39 y.o. G3P1011 with IUP at [redacted]w[redacted]d presenting for presenting for SOL with plan for scheduled cesarean section in 3 days. Strong regular contraction starting today, more intense. No acute concerns.   Prenatal Course Source of Care: [facility]   Pregnancy complications or risks:There are no active problems to display for this patient.  She plans to breastfeed, and formula feed She desires condoms for postpartum contraception.   Prenatal labs and studies: ABO, Rh: --/--/O POS, O POS (12/30 1235) Antibody: NEG (12/30 1235) Rubella: Immune (06/03 0000) RPR: Nonreactive (10/17 0000)  HBsAg: Negative (06/03 0000)  HIV: Non-reactive (06/03 0000)  GBS:  NEG 1 hr Glucola  wnl Genetic screening normal Anatomy US normal  Prenatal Transfer Tool  Maternal Diabetes: No Genetic Screening: Normal Maternal Ultrasounds/Referrals: Normal Fetal Ultrasounds or other Referrals:  None Maternal Substance Abuse:  No Significant Maternal Medications:  None Significant Maternal Lab Results: Lab values include: Group B Strep negative  Past Medical History  Diagnosis Date  . Medical history non-contributory     Past Surgical History  Procedure Laterality Date  . Cesarean section      OB History  Gravida Para Term Preterm AB SAB TAB Ectopic Multiple Living  # Outcome Date GA Lbr Len/2nd Weight Sex Delivery Anes PTL Lv  3 Current           2 Term           1 SAB               Social History   Social History  . Marital Status: Married    Spouse Name: N/A  . Number of Children: N/A  . Years of Education: N/A   Social History Main Topics  . Smoking status: Never Smoker   . Smokeless tobacco: Never Used  . Alcohol Use: No  . Drug Use: No  . Sexual Activity: Yes   Other Topics Concern  . None   Social History Narrative    Family History  Problem Relation Age of Onset  . Cancer Neg Hx   .  Diabetes Neg Hx   . Kidney disease Neg Hx   . Hypertension Neg Hx     Prescriptions prior to admission  Medication Sig Dispense Refill Last Dose  . acetaminophen (TYLENOL) 325 MG tablet Take 325 mg by mouth every 6 (six) hours as needed for mild pain.     . Prenatal Vit-Fe Fumarate-FA (PRENATAL MULTIVITAMIN) TABS tablet Take 1 tablet by mouth daily at 12 noon.       No Known Allergies  Review of Systems: Negative except for what is mentioned in HPI.  Physical Exam: BP 133/77 mmHg  Pulse 93  Temp(Src) 98.3 F (36.8 C) (Oral)  Resp 20  Ht 5' 3.5" (1.613 m)  Wt 176 lb 3.2 oz (79.924 kg)  BMI 30.72 kg/m2  LMP 09/28/2014 FHR by Doppler: 135 bpm CONSTITUTIONAL: Well-developed, well-nourished female in no acute distress.  HENT:  Normocephalic, atraumatic, External right and left ear normal. Oropharynx is clear and moist EYES: Conjunctivae and EOM are normal. Pupils are equal, round, and reactive to light. No scleral icterus.  NECK: Normal range of motion, supple, no masses SKIN: Skin is warm and dry. No rash noted. Not diaphoretic. No erythema. No pallor. NEUROLGIC: Alert and oriented to person, place, and time. Normal reflexes, muscle tone coordination. No cranial nerve  deficit noted. PSYCHIATRIC: Normal mood and affect. Normal behavior. Normal judgment and thought content. CARDIOVASCULAR: Normal heart rate noted, regular rhythm RESPIRATORY: Effort and breath sounds normal, no problems with respiration noted ABDOMEN: Soft, nontender, nondistended, gravid. Well-healed Pfannenstiel incision. PELVIC: Deferred MUSCULOSKELETAL: Normal range of motion. No edema and no tenderness. 2+ distal pulses.   Pertinent Labs/Studies:   Results for orders placed or performed during the hospital encounter of 06/25/15 (from the past 72 hour(s))  ABO/Rh     Status: None   Collection Time: 06/25/15 12:35 PM  Result Value Ref Range   ABO/RH(D) O POS   Urinalysis, Routine w reflex microscopic (not at  [facility])     Status: None   Collection Time: 06/25/15  1:10 PM  Result Value Ref Range   Color, Urine YELLOW YELLOW   APPearance CLEAR CLEAR   Specific Gravity, Urine 1.010 1.005 - 1.030   pH 6.0 5.0 - 8.0   Glucose, UA NEGATIVE NEGATIVE mg/dL   Hgb urine dipstick NEGATIVE NEGATIVE   Bilirubin Urine NEGATIVE NEGATIVE   Ketones, ur NEGATIVE NEGATIVE mg/dL   Protein, ur NEGATIVE NEGATIVE mg/dL   Nitrite NEGATIVE NEGATIVE   Leukocytes, UA NEGATIVE NEGATIVE    Comment: MICROSCOPIC NOT DONE ON URINES WITH NEGATIVE PROTEIN, BLOOD, LEUKOCYTES, NITRITE, OR GLUCOSE <1000 mg/dL.    Assessment and Plan :Shelley Peterson is a 39 y.o. G3P1011 at 4412w4d being admitted being admitted for SOL with plane for repeat cesarean section. The risks of cesarean section discussed with the patient included but were not limited to: bleeding which may require transfusion or reoperation; infection which may require antibiotics; injury to bowel, bladder, ureters or other surrounding organs; injury to the fetus; need for additional procedures including hysterectomy in the event of a life-threatening hemorrhage; placental abnormalities wth subsequent pregnancies, incisional problems, thromboembolic phenomenon and other postoperative/anesthesia complications. The patient concurred with the proposed plan, giving informed written consent for the procedure. Patient has been NPO since last night she will remain NPO for procedure. Anesthesia and OR aware. Preoperative prophylactic antibiotics and SCDs ordered on call to the OR. To OR when ready.   Federico FlakeKimberly Niles Shelley Kolinski, MD  OB Fellow Faculty Practice, [facility]

## 2015-06-25 NOTE — Transfer of Care (Signed)
Immediate Anesthesia Transfer of Care Note  Patient: Shelley Peterson  Procedure(s) Performed: Procedure(s): CESAREAN SECTION (N/A)  Patient Location: PACU  Anesthesia Type:Spinal  Level of Consciousness: awake, alert  and oriented  Airway & Oxygen Therapy: Patient Spontanous Breathing  Post-op Assessment: Report given to RN and Post -op Vital signs reviewed and stable  Post vital signs: Reviewed and stable  Last Vitals:  Filed Vitals:   06/25/15 1534 06/25/15 1535  BP: 133/77 133/77  Pulse: 93 93  Temp:    Resp:  20    Complications: No apparent anesthesia complications

## 2015-06-26 LAB — CBC
HEMATOCRIT: 32.3 % — AB (ref 36.0–46.0)
HEMOGLOBIN: 10.6 g/dL — AB (ref 12.0–15.0)
MCH: 28.3 pg (ref 26.0–34.0)
MCHC: 32.8 g/dL (ref 30.0–36.0)
MCV: 86.4 fL (ref 78.0–100.0)
Platelets: 225 10*3/uL (ref 150–400)
RBC: 3.74 MIL/uL — ABNORMAL LOW (ref 3.87–5.11)
RDW: 16.2 % — ABNORMAL HIGH (ref 11.5–15.5)
WBC: 11.6 10*3/uL — ABNORMAL HIGH (ref 4.0–10.5)

## 2015-06-26 LAB — RPR: RPR Ser Ql: NONREACTIVE

## 2015-06-26 NOTE — Lactation Note (Signed)
This note was copied from the chart of Shelley Len Stoffer. Lactation Consultation Note  Patient Name: Shelley Peterson ZOXWR'UToday's Date: 06/26/2015 Reason for consult: Initial assessment Mom c/o of nipple tenderness, no breakdown noted. Assisted Mom with positioning to obtain more depth with latch. Mom reported less pain with nursing. Baby demonstrates a good suckling patter with noted swallows. Care for sore nipples reviewed, advised to apply EBM, comfort gels given with instructions. Lots of colostrum with hand expression.  Basic teaching reviewed with parents. Cluster feeding discussed. Lactation brochure left for review, advised of OP services and support group. Encouraged to call for assist as needed. FOB present to interpret.   Maternal Data Has patient been taught Hand Expression?: Yes Does the patient have breastfeeding experience prior to this delivery?: Yes  Feeding Feeding Type: Breast Fed Length of feed: 25 min  LATCH Score/Interventions Latch: Grasps breast easily, tongue down, lips flanged, rhythmical sucking. Intervention(s): Adjust position;Assist with latch;Breast massage;Breast compression  Audible Swallowing: A few with stimulation  Type of Nipple: Everted at rest and after stimulation  Comfort (Breast/Nipple): Filling, red/small blisters or bruises, mild/mod discomfort  Problem noted: Mild/Moderate discomfort Interventions (Mild/moderate discomfort): Hand massage;Hand expression;Comfort gels  Hold (Positioning): Assistance needed to correctly position infant at breast and maintain latch. Intervention(s): Breastfeeding basics reviewed;Support Pillows;Position options  LATCH Score: 7  Lactation Tools Discussed/Used Tools: Comfort gels WIC Program: Yes   Consult Status Consult Status: Follow-up Date: 06/27/15 Follow-up type: In-patient    Shelley Peterson, Shelley Peterson 06/26/2015, 5:49 PM

## 2015-06-26 NOTE — Anesthesia Postprocedure Evaluation (Signed)
Anesthesia Post Note  Patient: Shelley Peterson  Procedure(s) Performed: Procedure(s) (LRB): CESAREAN SECTION (N/A)  Patient location during evaluation: Mother Baby Anesthesia Type: Epidural Level of consciousness: awake Pain management: satisfactory to patient Vital Signs Assessment: post-procedure vital signs reviewed and stable Respiratory status: spontaneous breathing Cardiovascular status: stable Anesthetic complications: no    Last Vitals:  Filed Vitals:   06/26/15 0347 06/26/15 0452  BP:  128/73  Pulse:  72  Temp:  36.6 C  Resp: 18 18    Last Pain:  Filed Vitals:   06/26/15 0456  PainSc: Asleep                 Cephus ShellingBURGER,Murriel Holwerda

## 2015-06-26 NOTE — Progress Notes (Signed)
Post Partum Day 1 Subjective: getting out of bed with assistance for the first time.  Objective: Blood pressure 110/58, pulse 81, temperature 98.9 F (37.2 C), temperature source Oral, resp. rate 20, height 5' 3.5" (1.613 m), weight 79.924 kg (176 lb 3.2 oz), last menstrual period 09/28/2014, SpO2 100 %, unknown if currently breastfeeding.  Physical Exam:  General: alert and cooperative Lochia: appropriate Uterine Fundus: firm Incision: no significant drainage DVT Evaluation: No evidence of DVT seen on physical exam.   Recent Labs  06/25/15 1235 06/26/15 0540  HGB 12.4 10.6*  HCT 37.4 32.3*    Assessment/Plan: Plan for discharge tomorrow, Breastfeeding and Contraception condoms   LOS: 1 day   Clemmons,Lori Grissett 06/26/2015, 8:55 AM

## 2015-06-26 NOTE — Addendum Note (Signed)
Addendum  created 06/26/15 0750 by Algis GreenhouseLinda A Corrisa Gibby, CRNA   Modules edited: Charges VN, Clinical Notes   Clinical Notes:  File: [id number]

## 2015-06-26 NOTE — Lactation Note (Signed)
This note was copied from the chart of Shelley Sarahjane Darrington. Lactation Consultation Note FOB has left to run errands and will be back later. He is mom's interpreter. I explained I could get interpreter on phone, she said wait my husband will be back and he can interpret for me. Understands some English but for teaching needs interpreter. Asked mom if BF going well, stated yes. I asked did you BF your other child, states yes I BF my first child, but didn't understand how long. Asked mom to call out when husband arrives for lactation. Patient Name: Shelley Norton Pastelkouwavi Biancardi WUJWJ'XToday's Date: 06/26/2015     Maternal Data    Feeding Length of feed: 15 min  LATCH Score/Interventions Latch: Repeated attempts needed to sustain latch, nipple held in mouth throughout feeding, stimulation needed to elicit sucking reflex. Intervention(s): Adjust position;Assist with latch  Audible Swallowing: A few with stimulation Intervention(s): Hand expression  Type of Nipple: Everted at rest and after stimulation  Comfort (Breast/Nipple): Soft / non-tender     Hold (Positioning): Assistance needed to correctly position infant at breast and maintain latch. Intervention(s): Breastfeeding basics reviewed  LATCH Score: 7  Lactation Tools Discussed/Used     Consult Status      Shelley Peterson, Shelley NickelLAURA G 06/26/2015, 3:02 PM

## 2015-06-27 NOTE — Lactation Note (Signed)
This note was copied from the chart of Girl Guliana Peltz. Lactation Consultation Note  Patient Name: Girl Norton Pastelkouwavi Chenard ZOXWR'UToday's Date: 06/27/2015 Reason for consult: Follow-up assessment   With this mom and a term baby, now 2844 hours old, and under double phototherapy. Mom c/o sore nipple tip, so with the aid of a [place] interpreter, through phone line, [place], [phone]. I assited mom with lataching baby deeply in football hold. Mom said this deep latch was painful, but once baby suckled for a minutes, it was not painful. She did not like football, and latched in cross Physiological scientistcradle onher own. She said she did not need help, but was very pleasant. Mom knows to call for questions/concerns.    Maternal Data    Feeding Feeding Type: Breast Fed Length of feed: 10 min  LATCH Score/Interventions Latch: Grasps breast easily, tongue down, lips flanged, rhythmical sucking. Intervention(s): Assist with latch  Audible Swallowing: A few with stimulation (lots of colostrum with hand expression)  Type of Nipple: Everted at rest and after stimulation  Comfort (Breast/Nipple): Soft / non-tender  Problem noted: Mild/Moderate discomfort (yip of nipples sore, appear intact)  Hold (Positioning): No assistance needed to correctly position infant at breast. Intervention(s): Breastfeeding basics reviewed;Support Pillows;Position options;Skin to skin  LATCH Score: 9  Lactation Tools Discussed/Used     Consult Status Consult Status: PRN Date: 06/28/15 Follow-up type: In-patient    Alfred LevinsLee, Aubrey Blackard Anne 06/27/2015, 3:15 PM

## 2015-06-27 NOTE — Progress Notes (Signed)
Subjective: Postpartum Day 2: Cesarean Delivery Patient reports tolerating PO, + flatus and no problems voiding.    Objective: Vital signs in last 24 hours: Temp:  [98 F (36.7 C)-99.2 F (37.3 C)] 98 F (36.7 C) (01/01 0649) Pulse Rate:  [68-79] 68 (01/01 0649) Resp:  [16-18] 18 (01/01 0649) BP: (117-125)/(64-73) 117/73 mmHg (01/01 0649) SpO2:  [100 %] 100 % (12/31 1220)  Physical Exam:  General: alert, cooperative and no distress Lochia: appropriate Uterine Fundus: firm Incision: honeycomb dressing present with small amount dark bleeding on left and right sides of dressing DVT Evaluation: No evidence of DVT seen on physical exam. Negative Homan's sign. No cords or calf tenderness. No significant calf/ankle edema.   Recent Labs  06/25/15 1235 06/26/15 0540  HGB 12.4 10.6*  HCT 37.4 32.3*    Assessment/Plan: Status post Cesarean section. Doing well postoperatively.  Continue current care.  LEFTWICH-KIRBY, Chelse Matas 06/27/2015, 10:34 AM

## 2015-06-28 ENCOUNTER — Encounter (HOSPITAL_COMMUNITY): Admission: RE | Payer: Self-pay | Source: Ambulatory Visit

## 2015-06-28 ENCOUNTER — Inpatient Hospital Stay (HOSPITAL_COMMUNITY): Admission: RE | Admit: 2015-06-28 | Payer: Medicaid Other | Source: Ambulatory Visit | Admitting: Family Medicine

## 2015-06-28 SURGERY — Surgical Case
Anesthesia: Regional | Site: Abdomen

## 2015-06-28 MED ORDER — OXYCODONE-ACETAMINOPHEN 5-325 MG PO TABS: 1.0000 | ORAL_TABLET | ORAL | Status: DC | PRN

## 2015-06-28 MED ORDER — IBUPROFEN 600 MG PO TABS: 600.0000 mg | ORAL_TABLET | Freq: Four times a day (QID) | ORAL | Status: DC

## 2015-06-28 NOTE — Discharge Instructions (Signed)

## 2015-06-28 NOTE — Discharge Summary (Signed)
OB Discharge Summary     Patient Name: Shelley Peterson DOB: 07-28-1975 MRN: [id number]  Date of admission: 06/25/2015 Delivering MD: Tilda Burrow   Date of discharge: 06/28/2015  Admitting diagnosis: 38wks, pain Intrauterine pregnancy: [redacted]w[redacted]d     Secondary diagnosis:  Active Problems:   S/P repeat low transverse C-section  Additional problems: prev C/S w/ desire for repeat     Discharge diagnosis: Term Pregnancy Delivered                                                                                                Post partum procedures:none  Augmentation: none  Complications: None  Hospital course:  Sceduled C/S   40 y.o. yo G3P2011 at [redacted]w[redacted]d was admitted to the hospital 06/25/2015 for scheduled cesarean section with the following indication:Elective Repeat. Pt arrived approx 3d prior to scheduled C/S in early labor; declined TOLAC so C/S proceeded. Membrane Rupture Time/Date: 6:48 PM ,06/25/2015   Patient delivered a Viable infant.06/25/2015  Details of operation can be found in separate operative note.  Pateint had an uncomplicated postpartum course.  She is ambulating, tolerating a regular diet, passing flatus, and urinating well. Patient is discharged home in stable condition on  06/28/2015          Physical exam  Filed Vitals:   06/26/15 1838 06/27/15 0649 06/27/15 1758 06/28/15 0500  BP: 125/64 117/73 118/71 110/72  Pulse: 79 68 77 74  Temp: 98.7 F (37.1 C) 98 F (36.7 C) 99 F (37.2 C) 98.7 F (37.1 C)  TempSrc: Oral Oral Oral Oral  Resp: 18 18 18 20   Height:      Weight:      SpO2:       General: alert and cooperative Lochia: appropriate Uterine Fundus: firm Incision: honeycomb w/ drainage at both ends, approx 30% DVT Evaluation: No evidence of DVT seen on physical exam. Labs: Lab Results  Component Value Date   WBC 11.6* 06/26/2015   HGB 10.6* 06/26/2015   HCT 32.3* 06/26/2015   MCV 86.4 06/26/2015   PLT 225 06/26/2015   No flowsheet  data found.  Discharge instruction: per After Visit Summary and "Baby and Me Booklet".  After visit meds:    Medication List    STOP taking these medications        acetaminophen 325 MG tablet  Commonly known as:  TYLENOL      TAKE these medications        ibuprofen 600 MG tablet  Commonly known as:  ADVIL,MOTRIN  Take 1 tablet (600 mg total) by mouth every 6 (six) hours.     oxyCODONE-acetaminophen 5-325 MG tablet  Commonly known as:  PERCOCET/ROXICET  Take 1 tablet by mouth every 4 (four) hours as needed (for pain scale 4-7).     prenatal multivitamin Tabs tablet  Take 1 tablet by mouth daily at 12 noon.        Diet: routine diet  Activity: Advance as tolerated. Pelvic rest for 6 weeks.   Outpatient follow up:6 weeks Follow up Appt:No future appointments. Follow up Visit:No Follow-up on file.  Postpartum contraception: Condoms  Newborn Data: Live born female  Birth Weight: 7 lb 3 oz (3260 g) APGAR: 8, 9  Baby Feeding: Bottle and Breast Disposition:home with mother   06/28/2015 Cam HaiSHAW, Cassidee Deats, CNM  8:44 AM

## 2015-06-28 NOTE — Progress Notes (Signed)
UR chart review completed.  

## 2015-06-29 ENCOUNTER — Encounter (HOSPITAL_COMMUNITY): Payer: Self-pay | Admitting: Obstetrics and Gynecology

## 2017-12-20 DIAGNOSIS — M545 Low back pain: Secondary | ICD-10-CM | POA: Diagnosis not present

## 2017-12-20 DIAGNOSIS — Z1239 Encounter for other screening for malignant neoplasm of breast: Secondary | ICD-10-CM | POA: Diagnosis not present

## 2017-12-20 DIAGNOSIS — Z13220 Encounter for screening for lipoid disorders: Secondary | ICD-10-CM | POA: Diagnosis not present

## 2017-12-20 DIAGNOSIS — Z131 Encounter for screening for diabetes mellitus: Secondary | ICD-10-CM | POA: Diagnosis not present

## 2018-01-03 DIAGNOSIS — M545 Low back pain: Secondary | ICD-10-CM | POA: Diagnosis not present

## 2018-01-03 DIAGNOSIS — E669 Obesity, unspecified: Secondary | ICD-10-CM | POA: Diagnosis not present

## 2018-02-26 DIAGNOSIS — Z1239 Encounter for other screening for malignant neoplasm of breast: Secondary | ICD-10-CM | POA: Diagnosis not present

## 2018-02-26 DIAGNOSIS — Z124 Encounter for screening for malignant neoplasm of cervix: Secondary | ICD-10-CM | POA: Diagnosis not present

## 2018-02-26 DIAGNOSIS — M545 Low back pain: Secondary | ICD-10-CM | POA: Diagnosis not present

## 2018-02-26 DIAGNOSIS — E669 Obesity, unspecified: Secondary | ICD-10-CM | POA: Diagnosis not present

## 2018-05-01 ENCOUNTER — Ambulatory Visit (INDEPENDENT_AMBULATORY_CARE_PROVIDER_SITE_OTHER): Payer: BLUE CROSS/BLUE SHIELD | Admitting: Obstetrics

## 2018-05-01 ENCOUNTER — Encounter: Payer: Self-pay | Admitting: Obstetrics

## 2018-05-01 ENCOUNTER — Other Ambulatory Visit (HOSPITAL_COMMUNITY)
Admission: RE | Admit: 2018-05-01 | Discharge: 2018-05-01 | Disposition: A | Discharge status: Home / Self Care | Payer: BLUE CROSS/BLUE SHIELD | Source: Ambulatory Visit | Attending: Obstetrics | Admitting: Obstetrics

## 2018-05-01 VITALS — BP 132/77 | HR 78 | Ht 63.0 in | Wt 170.2 lb

## 2018-05-01 DIAGNOSIS — R87610 Atypical squamous cells of undetermined significance on cytologic smear of cervix (ASC-US): Secondary | ICD-10-CM | POA: Diagnosis not present

## 2018-05-01 DIAGNOSIS — Z01419 Encounter for gynecological examination (general) (routine) without abnormal findings: Secondary | ICD-10-CM | POA: Diagnosis not present

## 2018-05-01 DIAGNOSIS — Z1239 Encounter for other screening for malignant neoplasm of breast: Secondary | ICD-10-CM

## 2018-05-01 DIAGNOSIS — Z1151 Encounter for screening for human papillomavirus (HPV): Secondary | ICD-10-CM | POA: Insufficient documentation

## 2018-05-01 DIAGNOSIS — Z01411 Encounter for gynecological examination (general) (routine) with abnormal findings: Secondary | ICD-10-CM | POA: Diagnosis not present

## 2018-05-01 DIAGNOSIS — Z3009 Encounter for other general counseling and advice on contraception: Secondary | ICD-10-CM

## 2018-05-01 NOTE — Progress Notes (Signed)
Pt presents for annual exam. She is a new patient. Pt states that her last pap smear was during her last pregnancy in 2016.

## 2018-05-01 NOTE — Progress Notes (Signed)
Subjective:        Shelley Peterson is a 42 y.o. female here for a routine exam.  Current complaints: None.    Personal health questionnaire:  Is patient Ashkenazi Jewish, have a family history of breast and/or ovarian cancer: no Is there a family history of uterine cancer diagnosed at age < 4, gastrointestinal cancer, urinary tract cancer, family member who is a Personnel officer syndrome-associated carrier: no Is the patient overweight and hypertensive, family history of diabetes, personal history of gestational diabetes, preeclampsia or PCOS: no Is patient over 83, have PCOS,  family history of premature CHD under age 65, diabetes, smoke, have hypertension or peripheral artery disease:  no At any time, has a partner hit, kicked or otherwise hurt or frightened you?: no Over the past 2 weeks, have you felt down, depressed or hopeless?: no Over the past 2 weeks, have you felt little interest or pleasure in doing things?:no   Gynecologic History Patient's last menstrual period was 04/20/2018. Contraception: condoms Last Pap: 2016. Results were: normal Last mammogram: n/a. Results were: n/a  Obstetric History OB History  Gravida Para Term Preterm AB Living  3 2 2   1 1   SAB TAB Ectopic Multiple Live Births  1     0 1    # Outcome Date GA Lbr Len/2nd Weight Sex Delivery Anes PTL Lv  3 Term 06/25/15 [redacted]w[redacted]d  7 lb 3 oz (3.26 kg) F CS-LTranv Spinal  LIV  2 Term           1 SAB             Past Medical History:  Diagnosis Date  . Medical history non-contributory     Past Surgical History:  Procedure Laterality Date  . CESAREAN SECTION    . CESAREAN SECTION N/A 06/25/2015   Procedure: CESAREAN SECTION;  Surgeon: Tilda Burrow, MD;  Location: [facility];  Service: Obstetrics;  Laterality: N/A;     Current Outpatient Medications:  .  diclofenac (VOLTAREN) 75 MG EC tablet, Take 1 tablet by mouth as directed., Disp: , Rfl: 2 .  ibuprofen (ADVIL,MOTRIN) 600 MG tablet, Take 1 tablet (600  mg total) by mouth every 6 (six) hours., Disp: 50 tablet, Rfl: 1 No Known Allergies  Social History   Tobacco Use  . Smoking status: Never Smoker  . Smokeless tobacco: Never Used  Substance Use Topics  . Alcohol use: No    Family History  Problem Relation Age of Onset  . Cancer Neg Hx   . Diabetes Neg Hx   . Kidney disease Neg Hx   . Hypertension Neg Hx       Review of Systems  Constitutional: negative for fatigue and weight loss Respiratory: negative for cough and wheezing Cardiovascular: negative for chest pain, fatigue and palpitations Gastrointestinal: negative for abdominal pain and change in bowel habits Musculoskeletal:negative for myalgias Neurological: negative for gait problems and tremors Behavioral/Psych: negative for abusive relationship, depression Endocrine: negative for temperature intolerance    Genitourinary:negative for abnormal menstrual periods, genital lesions, hot flashes, sexual problems and vaginal discharge Integument/breast: negative for breast lump, breast tenderness, nipple discharge and skin lesion(s)    Objective:       BP 132/77   Pulse 78   Ht 5\' 3"  (1.6 m)   Wt 170 lb 3.2 oz (77.2 kg)   LMP 04/20/2018   BMI 30.15 kg/m  General:   alert  Skin:   no rash or abnormalities  Lungs:  clear to auscultation bilaterally  Heart:   regular rate and rhythm, S1, S2 normal, no murmur, click, rub or gallop  Breasts:   normal without suspicious masses, skin or nipple changes or axillary nodes  Abdomen:  normal findings: no organomegaly, soft, non-tender and no hernia  Pelvis:  External genitalia: normal general appearance Urinary system: urethral meatus normal and bladder without fullness, nontender Vaginal: normal without tenderness, induration or masses Cervix: normal appearance Adnexa: normal bimanual exam Uterus: anteverted and non-tender, normal size   Lab Review Urine pregnancy test Labs reviewed yes Radiologic studies reviewed  no  50% of 20 min visit spent on counseling and coordination of care.   Assessment:     1. Encounter for routine gynecological examination with Papanicolaou smear of cervix Rx: - Cytology - PAP  2. Encounter for other general counseling and advice on contraception - wants to continue using condoms only  3. Screening breast examination Rx: - MM Digital Screening; Future   Plan:    Education reviewed: calcium supplements, depression evaluation, low fat, low cholesterol diet, safe sex/STD prevention, self breast exams and weight bearing exercise. Contraception: condoms. Hormone replacement therapy: not applicable. Mammogram ordered. Follow up in: 1 year.   No orders of the defined types were placed in this encounter.  Orders Placed This Encounter  Procedures  . MM Digital Screening    Standing Status:   Future    Standing Expiration Date:   07/02/2019    Order Specific Question:   Reason for Exam (SYMPTOM  OR DIAGNOSIS REQUIRED)    Answer:   Screening    Order Specific Question:   Is the patient pregnant?    Answer:   No    Order Specific Question:   Preferred imaging location?    Answer:   [facility]    Brock Bad MD 05-01-2018

## 2018-05-07 LAB — CYTOLOGY - PAP
Diagnosis: UNDETERMINED — AB
HPV (WINDOPATH): NOT DETECTED

## 2018-05-09 DIAGNOSIS — R030 Elevated blood-pressure reading, without diagnosis of hypertension: Secondary | ICD-10-CM | POA: Diagnosis not present

## 2018-05-09 DIAGNOSIS — J029 Acute pharyngitis, unspecified: Secondary | ICD-10-CM | POA: Diagnosis not present

## 2018-05-09 DIAGNOSIS — J069 Acute upper respiratory infection, unspecified: Secondary | ICD-10-CM | POA: Diagnosis not present

## 2018-05-28 DIAGNOSIS — E669 Obesity, unspecified: Secondary | ICD-10-CM | POA: Diagnosis not present

## 2018-05-28 DIAGNOSIS — Z23 Encounter for immunization: Secondary | ICD-10-CM | POA: Diagnosis not present

## 2018-05-28 DIAGNOSIS — M545 Low back pain: Secondary | ICD-10-CM | POA: Diagnosis not present

## 2018-05-28 DIAGNOSIS — Z6829 Body mass index (BMI) 29.0-29.9, adult: Secondary | ICD-10-CM | POA: Diagnosis not present

## 2018-06-12 ENCOUNTER — Ambulatory Visit
Admission: RE | Admit: 2018-06-12 | Discharge: 2018-06-12 | Disposition: A | Discharge status: Home / Self Care | Payer: BLUE CROSS/BLUE SHIELD | Source: Ambulatory Visit | Attending: Obstetrics | Admitting: Obstetrics

## 2018-06-12 DIAGNOSIS — Z1239 Encounter for other screening for malignant neoplasm of breast: Secondary | ICD-10-CM

## 2018-06-12 DIAGNOSIS — Z1231 Encounter for screening mammogram for malignant neoplasm of breast: Secondary | ICD-10-CM | POA: Diagnosis not present

## 2018-06-28 DIAGNOSIS — H6983 Other specified disorders of Eustachian tube, bilateral: Secondary | ICD-10-CM | POA: Diagnosis not present

## 2020-10-26 IMAGING — MG DIGITAL SCREENING BILATERAL MAMMOGRAM WITH CAD
4 series · 4 of 4 positions shown · non-contrast
Comparison: None.

CLINICAL DATA: Screening.

EXAM:
DIGITAL SCREENING BILATERAL MAMMOGRAM WITH CAD

[R MLO]
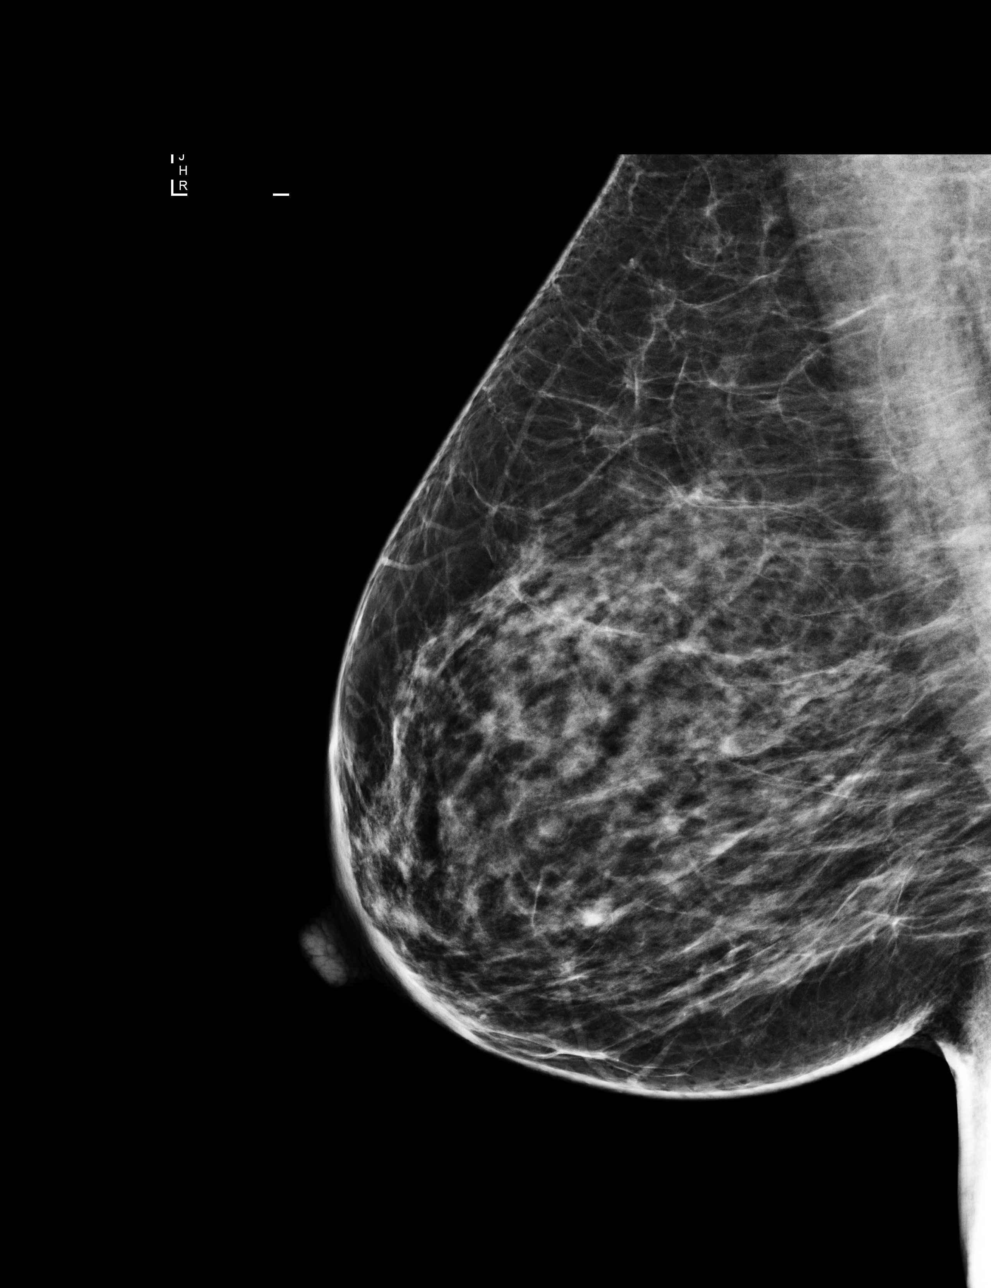

[L MLO]
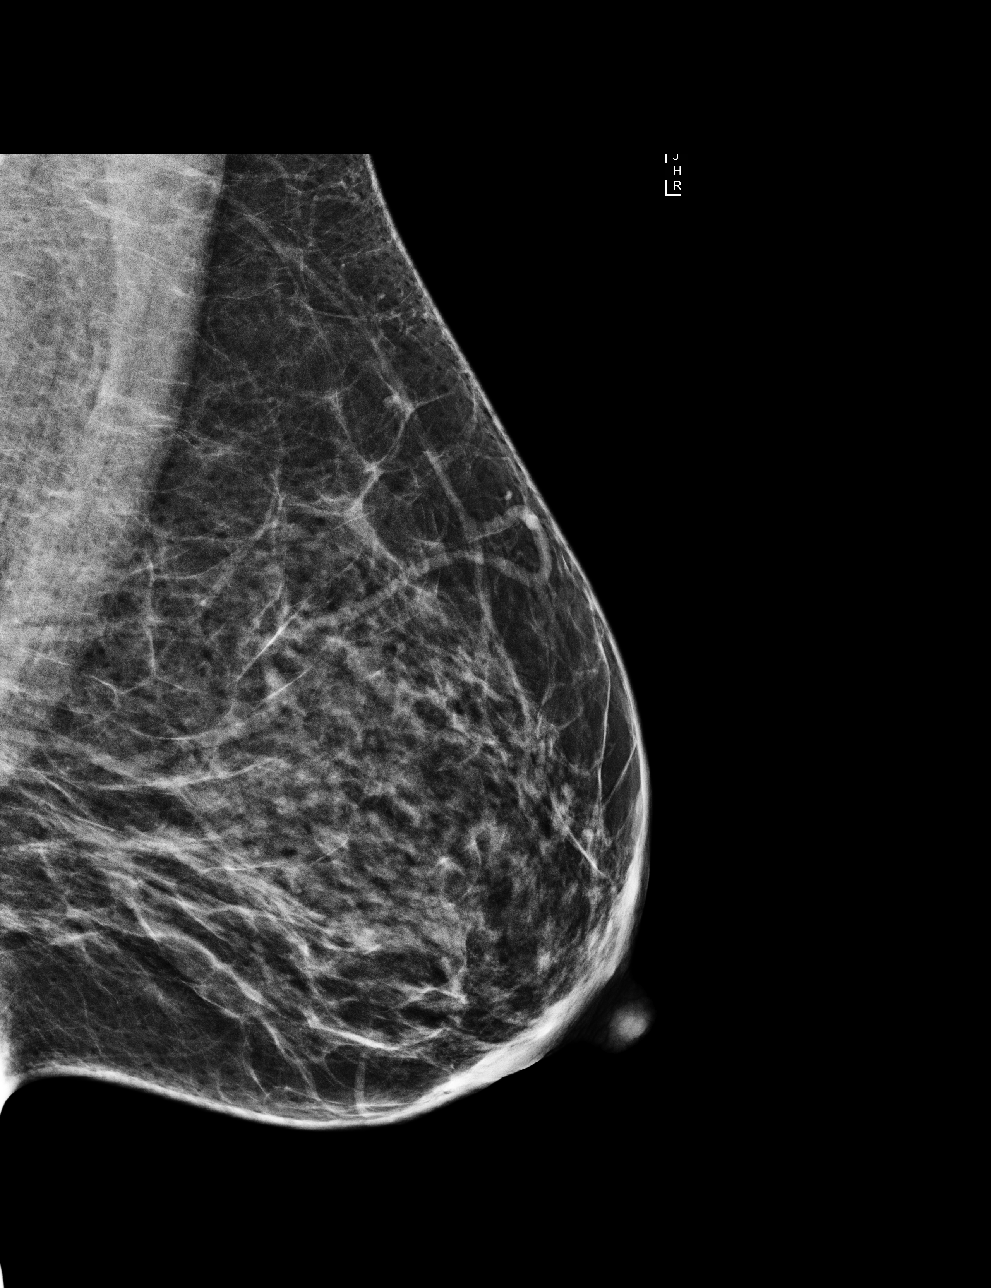

[L CC]
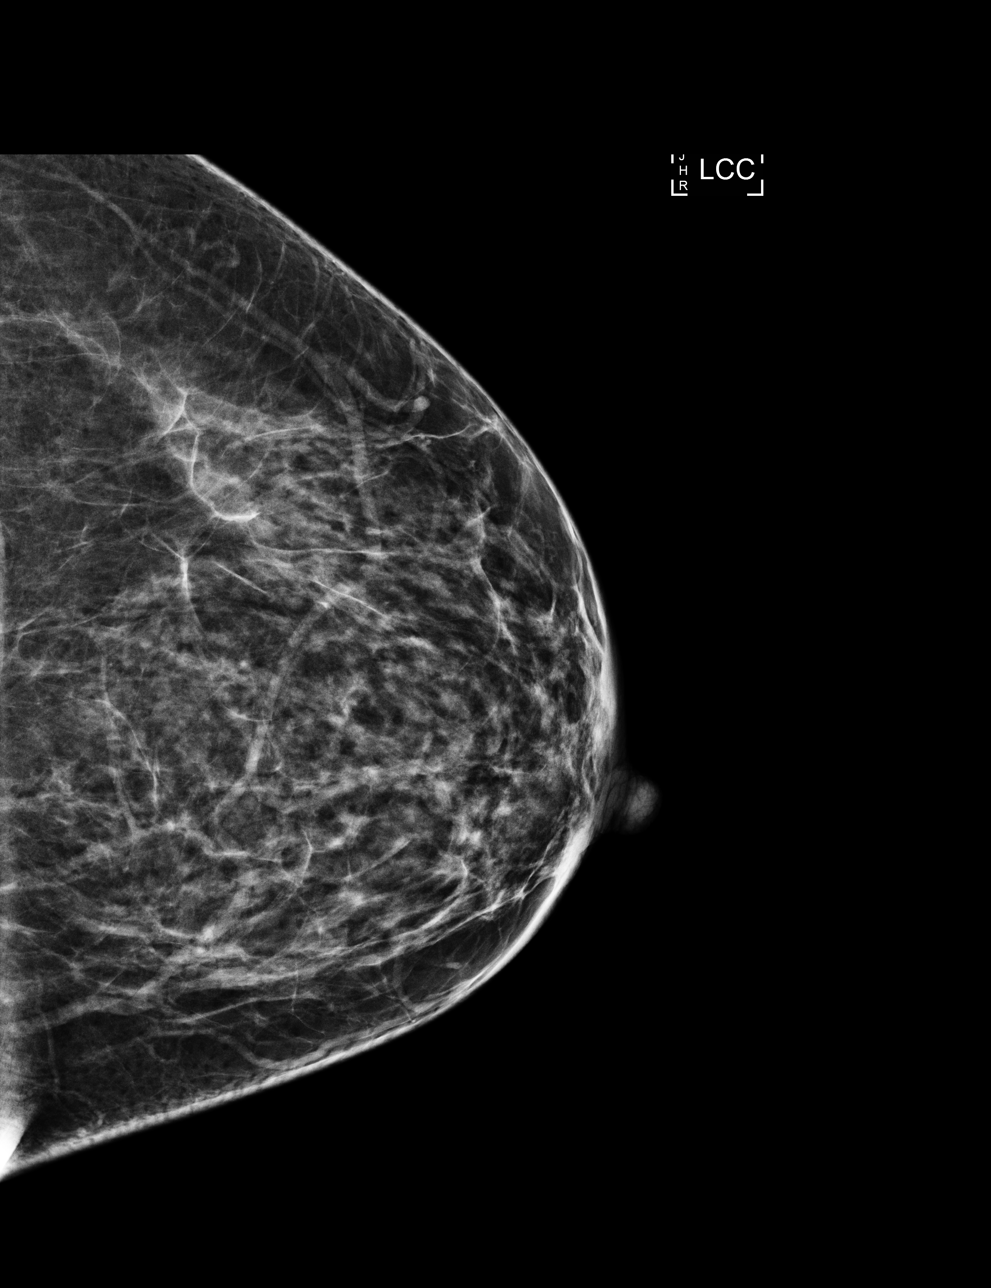

[R CC]
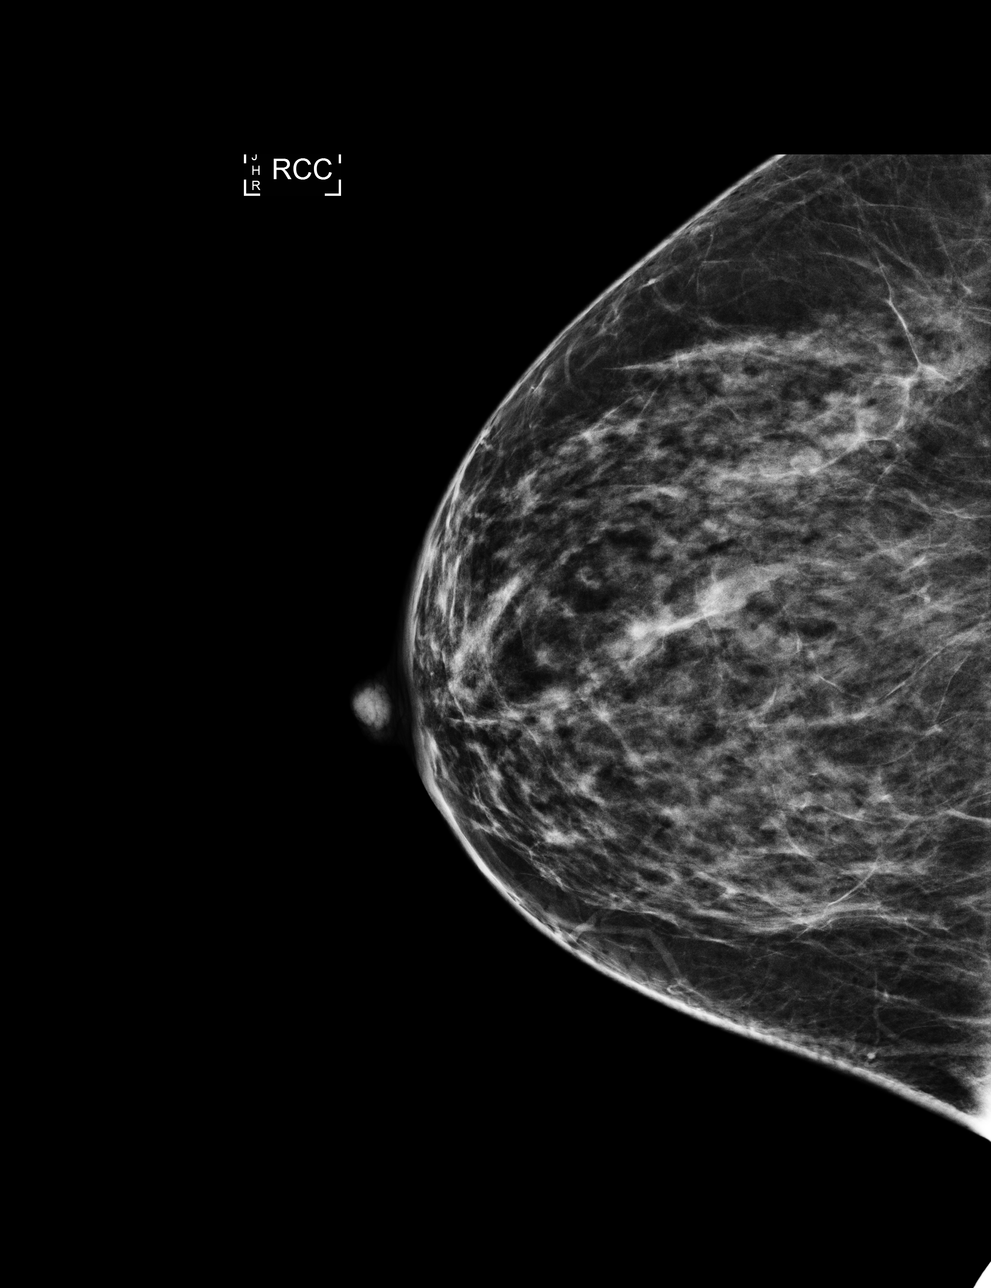

[4 of 4 positions shown; findings below may reference images not displayed]

ACR Breast Density Category b: There are scattered areas of
fibroglandular density.
FINDINGS: There are no findings suspicious for malignancy. Images were
processed with CAD.
IMPRESSION: No mammographic evidence of malignancy. A result letter of this
screening mammogram will be mailed directly to the patient.

RECOMMENDATION:
Screening mammogram in one year. (Code:[id number]-[id number])

BI-RADS CATEGORY  1: Negative.

## 2020-10-27 ENCOUNTER — Other Ambulatory Visit: Payer: Self-pay | Admitting: Internal Medicine

## 2020-10-28 LAB — LIPID PANEL
Cholesterol: 161 mg/dL (ref <200)
HDL: 49 mg/dL — ABNORMAL LOW (ref >=50)
LDL Cholesterol (Calc): 97 mg/dL (calc)
Non-HDL Cholesterol (Calc): 112 mg/dL (calc) (ref <130)
Total CHOL/HDL Ratio: 3.3 (calc) (ref <5.0)
Triglycerides: 68 mg/dL (ref <150)

## 2020-10-28 LAB — CBC
HCT: 35.2 % (ref 35.0–45.0)
Hemoglobin: 11.3 g/dL — ABNORMAL LOW (ref 11.7–15.5)
MCH: 28.7 pg (ref 27.0–33.0)
MCHC: 32.1 g/dL (ref 32.0–36.0)
MCV: 89.3 fL (ref 80.0–100.0)
MPV: 11.3 fL (ref 7.5–12.5)
Platelets: 239 10*3/uL (ref 140–400)
RBC: 3.94 10*6/uL (ref 3.80–5.10)
RDW: 12.9 % (ref 11.0–15.0)
WBC: 4.8 10*3/uL (ref 3.8–10.8)

## 2020-10-28 LAB — COMPLETE METABOLIC PANEL WITH GFR
AG Ratio: 1.5 (calc) (ref 1.0–2.5)
ALT: 11 U/L (ref 6–29)
AST: 18 U/L (ref 10–30)
Albumin: 4.1 g/dL (ref 3.6–5.1)
Alkaline phosphatase (APISO): 43 U/L (ref 31–125)
BUN: 14 mg/dL (ref 7–25)
CO2: 22 mmol/L (ref 20–32)
Calcium: 9.3 mg/dL (ref 8.6–10.2)
Chloride: 107 mmol/L (ref 98–110)
Creat: 0.93 mg/dL (ref 0.50–1.10)
GFR, Est African American: 87 mL/min/{1.73_m2} (ref >=60)
GFR, Est Non African American: 75 mL/min/{1.73_m2} (ref >=60)
Globulin: 2.8 g/dL (calc) (ref 1.9–3.7)
Glucose, Bld: 85 mg/dL (ref 65–99)
Potassium: 4.1 mmol/L (ref 3.5–5.3)
Sodium: 140 mmol/L (ref 135–146)
Total Bilirubin: 0.4 mg/dL (ref 0.2–1.2)
Total Protein: 6.9 g/dL (ref 6.1–8.1)

## 2020-10-28 LAB — VITAMIN D 25 HYDROXY (VIT D DEFICIENCY, FRACTURES): Vit D, 25-Hydroxy: 20 ng/mL — ABNORMAL LOW (ref 30–100)

## 2022-02-19 ENCOUNTER — Encounter (HOSPITAL_COMMUNITY): Payer: Self-pay

## 2022-02-19 ENCOUNTER — Other Ambulatory Visit: Payer: Self-pay

## 2022-02-19 ENCOUNTER — Emergency Department (HOSPITAL_COMMUNITY)
Admission: EM | Admit: 2022-02-19 | Discharge: 2022-02-19 | Disposition: A | Discharge status: Home / Self Care | Payer: Managed Care, Other (non HMO) | Attending: Emergency Medicine | Admitting: Emergency Medicine

## 2022-02-19 DIAGNOSIS — M62830 Muscle spasm of back: Secondary | ICD-10-CM

## 2022-02-19 DIAGNOSIS — M5441 Lumbago with sciatica, right side: Secondary | ICD-10-CM | POA: Diagnosis present

## 2022-02-19 MED ORDER — CYCLOBENZAPRINE HCL 10 MG PO TABS
10.0000 mg | ORAL_TABLET | Freq: Once | ORAL | Status: AC
  Administered 2022-02-19: 10 mg via ORAL
  Filled 2022-02-19: qty 1

## 2022-02-19 MED ORDER — DEXAMETHASONE SODIUM PHOSPHATE 10 MG/ML IJ SOLN
10.0000 mg | Freq: Once | INTRAMUSCULAR | Status: AC
  Administered 2022-02-19: 10 mg via INTRAMUSCULAR
  Filled 2022-02-19: qty 1

## 2022-02-19 MED ORDER — METHYLPREDNISOLONE 4 MG PO TBPK: ORAL_TABLET | ORAL | 0 refills | Status: DC

## 2022-02-19 MED ORDER — CYCLOBENZAPRINE HCL 10 MG PO TABS: 10.0000 mg | ORAL_TABLET | Freq: Two times a day (BID) | ORAL | 0 refills | Status: DC | PRN

## 2022-02-19 NOTE — ED Triage Notes (Signed)
Patient complains of back pain after bending yesterday to put on pants. Has hx of back pain. Denies urinary symptoms Took naproxen with no relief

## 2022-02-19 NOTE — ED Provider Notes (Signed)
[facility] EMERGENCY DEPARTMENT Provider Note   CSN: [id number] Arrival date & time: 02/19/22  0809     History  Chief Complaint  Patient presents with   Back Pain    Shelley Peterson is a 46 y.o. female.  Patient here with low back pain that started last night.  Pain happened after she bent over to try to put on some pants.  History of low back pain in the past.  Took naproxen this morning without much relief.  She denies any loss of bowel or bladder.  Denies any weakness or numbness.  Pain sometimes will shoot down into the right leg.  Pain mostly in the right lower back.  Feels like the muscles are stiff.  Denies any fever, chills.  No weakness or numbness.  Movement makes it worse.  Rest makes it better.  The history is provided by the patient.       Home Medications Prior to Admission medications   Medication Sig Start Date End Date Taking? Authorizing Provider  cyclobenzaprine (FLEXERIL) 10 MG tablet Take 1 tablet (10 mg total) by mouth 2 (two) times daily as needed for muscle spasms. 02/19/22  Yes Brelynn Wheller, DO  methylPREDNISolone (MEDROL DOSEPAK) 4 MG TBPK tablet Follow package insert 02/19/22  Yes Payal Stanforth, DO  diclofenac (VOLTAREN) 75 MG EC tablet Take 1 tablet by mouth as directed. 03/18/18   [provider]  ibuprofen (ADVIL,MOTRIN) 600 MG tablet Take 1 tablet (600 mg total) by mouth every 6 (six) hours. 06/28/15   Arabella Merles, CNM      Allergies    Patient has no known allergies.    Review of Systems   Review of Systems  Physical Exam Updated Vital Signs BP (!) 155/90   Pulse 64   Temp 98 F (36.7 C) (Temporal)   Resp 18   SpO2 100%  Physical Exam Vitals and nursing note reviewed.  Constitutional:      General: She is not in acute distress.    Appearance: She is well-developed. She is not ill-appearing.  HENT:     Head: Normocephalic and atraumatic.     Nose: Nose normal.     Mouth/Throat:     Mouth:  Mucous membranes are moist.  Eyes:     Extraocular Movements: Extraocular movements intact.     Conjunctiva/sclera: Conjunctivae normal.     Pupils: Pupils are equal, round, and reactive to light.  Cardiovascular:     Rate and Rhythm: Normal rate and regular rhythm.     Pulses: Normal pulses.     Heart sounds: Normal heart sounds. No murmur heard. Pulmonary:     Effort: Pulmonary effort is normal. No respiratory distress.     Breath sounds: Normal breath sounds.  Abdominal:     General: Abdomen is flat.     Palpations: Abdomen is soft.     Tenderness: There is no abdominal tenderness.  Musculoskeletal:        General: Tenderness present. No swelling.     Cervical back: Normal range of motion and neck supple.     Comments: Tenderness to the paraspinal muscles on the right with increased tone, no midline spinal tenderness  Skin:    General: Skin is warm and dry.     Capillary Refill: Capillary refill takes less than 2 seconds.  Neurological:     General: No focal deficit present.     Mental Status: She is alert.     Sensory: No sensory  deficit.     Motor: No weakness.     Comments: 5+ out of 5 strength throughout, normal sensation  Psychiatric:        Mood and Affect: Mood normal.     ED Results / Procedures / Treatments   Labs (all labs ordered are listed, but only abnormal results are displayed) Labs Reviewed - No data to display  EKG None  Radiology No results found.  Procedures Procedures    Medications Ordered in ED Medications  cyclobenzaprine (FLEXERIL) tablet 10 mg (10 mg Oral Given 02/19/22 1220)  dexamethasone (DECADRON) injection 10 mg (10 mg Intramuscular Given 02/19/22 1221)    ED Course/ Medical Decision Making/ A&P                           Medical Decision Making Risk Prescription drug management.   Shelley Peterson is here with low back pain.  Pain after bending over yesterday.  No loss of bowel or bladder.  No weakness or numbness.   Having some shooting pain down the right leg at times.  Feels like her muscle is tight in the right lower back.  No problems going to the bathroom.  No abdominal pain, nausea, vomiting.  Has some chronic back pain.  Took meloxicam with some improvement.  Neurovascular neuromuscular she is intact on exam.  She is tender in the right sided paraspinal muscles.  She has normal strength and sensation throughout.  Differential diagnosis is likely muscle spasm/sciatica.  I have no concern for cauda equina or other acute neurologic process.  Patient given Decadron and Flexeril with improvement.  Will prescribe her Medrol Dosepak, Flexeril.  Recommend follow-up with primary care doctor.  Discharged in good condition.  This chart was dictated using voice recognition software.  Despite best efforts to proofread,  errors can occur which can change the documentation meaning.           Final Clinical Impression(s) / ED Diagnoses Final diagnoses:  Acute right-sided low back pain with right-sided sciatica  Muscle spasm of back    Rx / DC Orders ED Discharge Orders          Ordered    methylPREDNISolone (MEDROL DOSEPAK) 4 MG TBPK tablet        02/19/22 1229    cyclobenzaprine (FLEXERIL) 10 MG tablet  2 times daily PRN        02/19/22 1229              Virgina Norfolk, DO 02/19/22 1231

## 2022-02-19 NOTE — Discharge Instructions (Signed)
Take next dose of steroid/Medrol Dosepak tomorrow.  Recommend 1000 mg of Tylenol every 6 hours as needed for pain as well.  Recommend Flexeril as prescribed.  This medication is sedating so do not use with alcohol or drugs or other dangerous activities including driving.  Follow-up with your primary care doctor.

## 2022-12-04 ENCOUNTER — Other Ambulatory Visit: Payer: Self-pay | Admitting: Internal Medicine

## 2022-12-07 LAB — COMPLETE METABOLIC PANEL WITH GFR
AG Ratio: 1.6 (calc) (ref 1.0–2.5)
ALT: 10 U/L (ref 6–29)
AST: 17 U/L (ref 10–35)
Albumin: 4.2 g/dL (ref 3.6–5.1)
Alkaline phosphatase (APISO): 46 U/L (ref 31–125)
BUN: 13 mg/dL (ref 7–25)
CO2: 23 mmol/L (ref 20–32)
Calcium: 9.1 mg/dL (ref 8.6–10.2)
Chloride: 108 mmol/L (ref 98–110)
Creat: 0.81 mg/dL (ref 0.50–0.99)
Globulin: 2.7 g/dL (calc) (ref 1.9–3.7)
Glucose, Bld: 80 mg/dL (ref 65–99)
Potassium: 4 mmol/L (ref 3.5–5.3)
Sodium: 140 mmol/L (ref 135–146)
Total Bilirubin: 0.5 mg/dL (ref 0.2–1.2)
Total Protein: 6.9 g/dL (ref 6.1–8.1)
eGFR: 91 mL/min/{1.73_m2} (ref >=60)

## 2022-12-07 LAB — LIPID PANEL
Cholesterol: 157 mg/dL (ref <200)
HDL: 57 mg/dL (ref >=50)
LDL Cholesterol (Calc): 89 mg/dL (calc)
Non-HDL Cholesterol (Calc): 100 mg/dL (calc) (ref <130)
Total CHOL/HDL Ratio: 2.8 (calc) (ref <5.0)
Triglycerides: 38 mg/dL (ref <150)

## 2022-12-07 LAB — VITAMIN D 25 HYDROXY (VIT D DEFICIENCY, FRACTURES): Vit D, 25-Hydroxy: 34 ng/mL (ref 30–100)

## 2022-12-07 LAB — CBC
HCT: 36.9 % (ref 35.0–45.0)
Hemoglobin: 11.6 g/dL — ABNORMAL LOW (ref 11.7–15.5)
MCH: 28 pg (ref 27.0–33.0)
MCHC: 31.4 g/dL — ABNORMAL LOW (ref 32.0–36.0)
MCV: 89.1 fL (ref 80.0–100.0)
MPV: 11.8 fL (ref 7.5–12.5)
Platelets: 254 10*3/uL (ref 140–400)
RBC: 4.14 10*6/uL (ref 3.80–5.10)
RDW: 13.1 % (ref 11.0–15.0)
WBC: 3.7 10*3/uL — ABNORMAL LOW (ref 3.8–10.8)

## 2022-12-07 LAB — HELICOBACTER PYLORI  SPECIAL ANTIGEN

## 2022-12-07 LAB — TSH: TSH: 0.95 mIU/L

## 2022-12-12 ENCOUNTER — Other Ambulatory Visit: Payer: Self-pay | Admitting: Internal Medicine

## 2022-12-14 LAB — HELICOBACTER PYLORI  SPECIAL ANTIGEN
MICRO NUMBER:: 15096785
SPECIMEN QUALITY: ADEQUATE

## 2023-11-23 ENCOUNTER — Ambulatory Visit: Admitting: Obstetrics and Gynecology

## 2023-11-23 ENCOUNTER — Encounter: Payer: Self-pay | Admitting: Obstetrics and Gynecology

## 2023-11-23 ENCOUNTER — Other Ambulatory Visit (HOSPITAL_COMMUNITY)
Admission: RE | Admit: 2023-11-23 | Discharge: 2023-11-23 | Disposition: A | Discharge status: Home / Self Care | Source: Ambulatory Visit | Attending: Obstetrics and Gynecology | Admitting: Obstetrics and Gynecology

## 2023-11-23 VITALS — BP 174/91 | HR 83 | Ht 64.0 in | Wt 166.0 lb

## 2023-11-23 DIAGNOSIS — Z01419 Encounter for gynecological examination (general) (routine) without abnormal findings: Secondary | ICD-10-CM | POA: Insufficient documentation

## 2023-11-23 DIAGNOSIS — Z1231 Encounter for screening mammogram for malignant neoplasm of breast: Secondary | ICD-10-CM

## 2023-11-23 DIAGNOSIS — Z113 Encounter for screening for infections with a predominantly sexual mode of transmission: Secondary | ICD-10-CM | POA: Insufficient documentation

## 2023-11-23 DIAGNOSIS — Z1331 Encounter for screening for depression: Secondary | ICD-10-CM

## 2023-11-23 DIAGNOSIS — R300 Dysuria: Secondary | ICD-10-CM

## 2023-11-23 LAB — POCT URINALYSIS DIPSTICK
Bilirubin, UA: NEGATIVE
Blood, UA: NEGATIVE
Glucose, UA: NEGATIVE
Ketones, UA: NEGATIVE
Leukocytes, UA: NEGATIVE
Nitrite, UA: NEGATIVE
Protein, UA: POSITIVE — AB
Spec Grav, UA: >=1.03 — AB (ref 1.010–1.025)
Urobilinogen, UA: 0.2 U/dL
pH, UA: 6 (ref 5.0–8.0)

## 2023-11-23 NOTE — Patient Instructions (Signed)

## 2023-11-23 NOTE — Progress Notes (Signed)
 GYNECOLOGY ANNUAL PREVENTATIVE CARE ENCOUNTER NOTE  History:      Shelley Peterson is a 48 y.o. G76P2012 female here for a routine annual gynecologic exam.  Current complaints: none.   Denies abnormal vaginal bleeding, discharge, pelvic pain, problems with intercourse or other gynecologic concerns.  Pt notes regular menses lasting 3 days. Pt had recent UTI for which she took abx from PCP, but she did not finish the rx.   Gynecologic History Patient's last menstrual period was 10/31/2023 (exact date). Contraception: none Last Pap: 2019. Results were: ASCUS with negative HPV Last mammogram: 2019. Results were: normal  Obstetric History OB History  Gravida Para Term Preterm AB Living  3 2 2  1 2   SAB IAB Ectopic Multiple Live Births  1   0 2    # Outcome Date GA Lbr Len/2nd Weight Sex Type Anes PTL Lv  3 Term 06/25/15 [redacted]w[redacted]d  7 lb 3 oz (3.26 kg) F CS-LTranv Spinal  LIV  2 Term 07/27/12    F CS-LTranv   LIV  1 SAB             Past Medical History:  Diagnosis Date   Medical history non-contributory    Vaginal Pap smear, abnormal     Past Surgical History:  Procedure Laterality Date   CESAREAN SECTION     CESAREAN SECTION N/A 06/25/2015   Procedure: CESAREAN SECTION;  Surgeon: Albino Hum, MD;  Location: [facility];  Service: Obstetrics;  Laterality: N/A;    No current outpatient medications on file prior to visit.   No current facility-administered medications on file prior to visit.    No Known Allergies  Social History:  reports that she has never smoked. She has never used smokeless tobacco. She reports that she does not drink alcohol and does not use drugs.  Family History  Problem Relation Age of Onset   Cancer Neg Hx    Diabetes Neg Hx    Kidney disease Neg Hx    Hypertension Neg Hx     The following portions of the patient's history were reviewed and updated as appropriate: allergies, current medications, past family history, past medical history,  past social history, past surgical history and problem list.  Review of Systems Pertinent items noted in HPI and remainder of comprehensive ROS otherwise negative.  Physical Exam:  BP (!) 165/84 (BP Location: Right Arm, Cuff Size: Large)   Pulse 75   Ht 5\' 4"  (1.626 m)   Wt 166 lb (75.3 kg)   LMP 10/31/2023 (Exact Date)   Breastfeeding No   BMI 28.49 kg/m  CONSTITUTIONAL: Well-developed, well-nourished female in no acute distress.  HENT:  Normocephalic, atraumatic, External right and left ear normal. Oropharynx is clear and moist EYES: Conjunctivae and EOM are normal.  NECK: Normal range of motion, supple, no masses.  Normal thyroid.  SKIN: Skin is warm and dry. No rash noted. Not diaphoretic. No erythema. No pallor. MUSCULOSKELETAL: Normal range of motion. No tenderness.  No cyanosis, clubbing, or edema.  2+ distal pulses. NEUROLOGIC: Alert and oriented to person, place, and time. Normal reflexes, muscle tone coordination.  PSYCHIATRIC: Normal mood and affect. Normal behavior. Normal judgment and thought content. CARDIOVASCULAR: Normal heart rate noted, regular rhythm RESPIRATORY: Clear to auscultation bilaterally. Effort and breath sounds normal, no problems with respiration noted. BREASTS: Symmetric in size. No masses, tenderness, skin changes, nipple drainage, or lymphadenopathy bilaterally. Performed in the presence of a chaperone. ABDOMEN: Soft, no distention noted.  No tenderness, rebound or guarding.  PELVIC: Normal appearing external genitalia and urethral meatus; normal appearing vaginal mucosa and cervix.  No abnormal discharge noted.  Pap smear obtained.  Vaginal swab taken. Normal uterine size, no other palpable masses, no uterine or adnexal tenderness.  Performed in the presence of a chaperone.   Assessment and Plan:    1. Women's annual routine gynecological examination (Primary) Normal annual exam - Cytology - PAP( [place])  2. Routine screening for STI (sexually  transmitted infection) Per pt request - Cervicovaginal ancillary only( [place]) - HIV antibody (with reflex) - Hepatitis C Antibody - Hepatitis B Surface AntiGEN - [facility]  3. Encounter for screening mammogram for malignant neoplasm of breast Will schedule - MM 3D SCREENING MAMMOGRAM BILATERAL BREAST; Future  4. Dysuria Pt recently treated for UTI, but did not finish, will recheck urine culture - POCT Urinalysis Dipstick - Urine Culture  Will follow up results of pap smear and manage accordingly. Mammogram scheduled Routine preventative health maintenance measures emphasized. Please refer to After Visit Summary for other counseling recommendations.     F/u in 1 year or PRN Pt advised to follow up with PCP regarding elevated BP Avie Boeck, MD, FACOG Obstetrician & Gynecologist, [facility] for [employer], [facility]

## 2023-11-23 NOTE — Progress Notes (Signed)
 48 y.o. GYN presents for AEX/PAP/STD screening.  C/o pelvic pain 2/10, CS pain 3-4/10.  She was treated for UTI but did not complete meds, because it made her spot.

## 2023-11-24 LAB — HEPATITIS C ANTIBODY: Hep C Virus Ab: NONREACTIVE

## 2023-11-24 LAB — HIV ANTIBODY (ROUTINE TESTING W REFLEX): HIV Screen 4th Generation wRfx: NONREACTIVE

## 2023-11-24 LAB — RPR: RPR Ser Ql: NONREACTIVE

## 2023-11-24 LAB — HEPATITIS B SURFACE ANTIGEN: Hepatitis B Surface Ag: NEGATIVE

## 2023-11-26 ENCOUNTER — Ambulatory Visit: Payer: Self-pay | Admitting: Obstetrics and Gynecology

## 2023-11-26 LAB — CERVICOVAGINAL ANCILLARY ONLY
Bacterial Vaginitis (gardnerella): NEGATIVE
Candida Glabrata: NEGATIVE
Candida Vaginitis: NEGATIVE
Chlamydia: NEGATIVE
Comment: NEGATIVE
Comment: NEGATIVE
Comment: NEGATIVE
Comment: NEGATIVE
Comment: NEGATIVE
Comment: NORMAL
Neisseria Gonorrhea: NEGATIVE
Trichomonas: NEGATIVE

## 2023-11-27 ENCOUNTER — Other Ambulatory Visit: Payer: Self-pay | Admitting: Obstetrics and Gynecology

## 2023-11-27 DIAGNOSIS — N3000 Acute cystitis without hematuria: Secondary | ICD-10-CM

## 2023-11-27 LAB — URINE CULTURE

## 2023-11-27 MED ORDER — CEFADROXIL 500 MG PO CAPS: 500.0000 mg | ORAL_CAPSULE | Freq: Two times a day (BID) | ORAL | 0 refills | Status: AC

## 2023-11-27 NOTE — Progress Notes (Signed)
 Rx sent for duricef

## 2023-11-28 LAB — CYTOLOGY - PAP
Adequacy: ABSENT
Comment: NEGATIVE
Diagnosis: NEGATIVE
Diagnosis: REACTIVE
High risk HPV: NEGATIVE
